# Patient Record
Sex: Female | Born: 1985 | Race: White | Hispanic: No | Smoking: Never smoker
Health system: Southern US, Community
[De-identification: ages and names within clinical notes are randomized; demographics above are authoritative.]

## PROBLEM LIST (undated history)

## (undated) DIAGNOSIS — M2242 Chondromalacia patellae, left knee: Secondary | ICD-10-CM

## (undated) DIAGNOSIS — F329 Major depressive disorder, single episode, unspecified: Secondary | ICD-10-CM

## (undated) DIAGNOSIS — N870 Mild cervical dysplasia: Secondary | ICD-10-CM

## (undated) DIAGNOSIS — B1920 Unspecified viral hepatitis C without hepatic coma: Secondary | ICD-10-CM

## (undated) DIAGNOSIS — F32A Depression, unspecified: Secondary | ICD-10-CM

## (undated) DIAGNOSIS — F29 Unspecified psychosis not due to a substance or known physiological condition: Secondary | ICD-10-CM

## (undated) DIAGNOSIS — S52209A Unspecified fracture of shaft of unspecified ulna, initial encounter for closed fracture: Secondary | ICD-10-CM

## (undated) DIAGNOSIS — M2241 Chondromalacia patellae, right knee: Secondary | ICD-10-CM

## (undated) DIAGNOSIS — F1910 Other psychoactive substance abuse, uncomplicated: Secondary | ICD-10-CM

## (undated) DIAGNOSIS — O1490 Unspecified pre-eclampsia, unspecified trimester: Secondary | ICD-10-CM

## (undated) DIAGNOSIS — M1990 Unspecified osteoarthritis, unspecified site: Secondary | ICD-10-CM

## (undated) HISTORY — DX: Unspecified pre-eclampsia, unspecified trimester: O14.90

## (undated) HISTORY — PX: INCONTINENCE SURGERY: SHX676

## (undated) HISTORY — DX: Mild cervical dysplasia: N87.0

## (undated) HISTORY — PX: KNEE SURGERY: SHX244

## (undated) HISTORY — PX: OTHER SURGICAL HISTORY: SHX169

## (undated) HISTORY — PX: DILATION AND CURETTAGE OF UTERUS: SHX78

## (undated) HISTORY — PX: TONSILLECTOMY: SUR1361

---

## 2005-06-26 ENCOUNTER — Other Ambulatory Visit: Admission: RE | Admit: 2005-06-26 | Discharge: 2005-06-26 | Payer: Self-pay | Admitting: Obstetrics and Gynecology

## 2005-07-18 ENCOUNTER — Other Ambulatory Visit: Admission: RE | Admit: 2005-07-18 | Discharge: 2005-07-18 | Payer: Self-pay | Admitting: Obstetrics and Gynecology

## 2006-01-11 ENCOUNTER — Other Ambulatory Visit: Admission: RE | Admit: 2006-01-11 | Discharge: 2006-01-11 | Payer: Self-pay | Admitting: Obstetrics and Gynecology

## 2006-06-07 ENCOUNTER — Inpatient Hospital Stay (HOSPITAL_COMMUNITY): Admission: AD | Admit: 2006-06-07 | Discharge: 2006-06-08 | Payer: Self-pay | Admitting: Obstetrics and Gynecology

## 2006-07-07 ENCOUNTER — Inpatient Hospital Stay (HOSPITAL_COMMUNITY): Admission: AD | Admit: 2006-07-07 | Discharge: 2006-07-10 | Payer: Self-pay | Admitting: Obstetrics and Gynecology

## 2006-07-11 ENCOUNTER — Observation Stay (HOSPITAL_COMMUNITY): Admission: AD | Admit: 2006-07-11 | Discharge: 2006-07-12 | Payer: Self-pay | Admitting: Obstetrics and Gynecology

## 2006-07-19 ENCOUNTER — Inpatient Hospital Stay (HOSPITAL_COMMUNITY): Admission: AD | Admit: 2006-07-19 | Discharge: 2006-07-23 | Payer: Self-pay | Admitting: Obstetrics and Gynecology

## 2008-06-08 ENCOUNTER — Ambulatory Visit (HOSPITAL_BASED_OUTPATIENT_CLINIC_OR_DEPARTMENT_OTHER): Admission: RE | Admit: 2008-06-08 | Discharge: 2008-06-08 | Payer: Self-pay | Admitting: Urology

## 2008-12-16 ENCOUNTER — Encounter: Admission: RE | Admit: 2008-12-16 | Discharge: 2008-12-16 | Payer: Self-pay | Admitting: Orthopaedic Surgery

## 2009-08-02 ENCOUNTER — Ambulatory Visit (HOSPITAL_COMMUNITY): Admission: RE | Admit: 2009-08-02 | Discharge: 2009-08-02 | Payer: Self-pay | Admitting: Obstetrics and Gynecology

## 2009-09-02 ENCOUNTER — Ambulatory Visit (HOSPITAL_COMMUNITY): Admission: RE | Admit: 2009-09-02 | Discharge: 2009-09-02 | Payer: Self-pay | Admitting: Obstetrics and Gynecology

## 2009-12-13 ENCOUNTER — Emergency Department (HOSPITAL_COMMUNITY): Admission: EM | Admit: 2009-12-13 | Discharge: 2009-12-13 | Payer: Self-pay | Admitting: Emergency Medicine

## 2010-08-20 LAB — URINALYSIS, ROUTINE W REFLEX MICROSCOPIC
Glucose, UA: 500 mg/dL — AB
Ketones, ur: NEGATIVE mg/dL
pH: 6 (ref 5.0–8.0)

## 2010-08-20 LAB — CBC
HCT: 37.9 % (ref 36.0–46.0)
Hemoglobin: 13 g/dL (ref 12.0–15.0)
MCHC: 34.3 g/dL (ref 30.0–36.0)
MCV: 91.3 fL (ref 78.0–100.0)
Platelets: 137 10*3/uL — ABNORMAL LOW (ref 150–400)
WBC: 8.3 10*3/uL (ref 4.0–10.5)

## 2010-08-20 LAB — COMPREHENSIVE METABOLIC PANEL
ALT: 16 U/L (ref 0–35)
AST: 23 U/L (ref 0–37)
BUN: 20 mg/dL (ref 6–23)
Calcium: 9.3 mg/dL (ref 8.4–10.5)
Chloride: 96 mEq/L (ref 96–112)
Potassium: 4 mEq/L (ref 3.5–5.1)
Sodium: 134 mEq/L — ABNORMAL LOW (ref 135–145)
Total Bilirubin: 0.5 mg/dL (ref 0.3–1.2)
Total Protein: 8 g/dL (ref 6.0–8.3)

## 2010-08-20 LAB — DIFFERENTIAL
Monocytes Absolute: 1.3 10*3/uL — ABNORMAL HIGH (ref 0.1–1.0)
Neutro Abs: 6.3 10*3/uL (ref 1.7–7.7)
Neutrophils Relative %: 76 % (ref 43–77)

## 2010-08-20 LAB — LIPASE, BLOOD: Lipase: 48 U/L (ref 11–59)

## 2010-08-20 LAB — URINE MICROSCOPIC-ADD ON

## 2010-08-27 LAB — CBC
HCT: 36.7 % (ref 36.0–46.0)
Hemoglobin: 12.2 g/dL (ref 12.0–15.0)
MCHC: 33.4 g/dL (ref 30.0–36.0)
RBC: 3.92 MIL/uL (ref 3.87–5.11)
RDW: 13.1 % (ref 11.5–15.5)

## 2010-08-29 ENCOUNTER — Emergency Department (HOSPITAL_COMMUNITY)
Admission: EM | Admit: 2010-08-29 | Discharge: 2010-08-29 | Disposition: A | Discharge status: Home / Self Care | Payer: Medicaid Other | Attending: Emergency Medicine | Admitting: Emergency Medicine

## 2010-08-29 DIAGNOSIS — M549 Dorsalgia, unspecified: Secondary | ICD-10-CM | POA: Insufficient documentation

## 2010-09-14 ENCOUNTER — Institutional Professional Consult (permissible substitution): Payer: Self-pay | Admitting: Cardiology

## 2010-09-14 ENCOUNTER — Ambulatory Visit (INDEPENDENT_AMBULATORY_CARE_PROVIDER_SITE_OTHER): Payer: Medicaid Other | Admitting: Cardiology

## 2010-09-14 ENCOUNTER — Encounter: Payer: Self-pay | Admitting: Cardiology

## 2010-09-14 VITALS — BP 106/68 | HR 84 | Resp 12 | Ht 62.0 in | Wt 123.0 lb

## 2010-09-14 DIAGNOSIS — IMO0002 Reserved for concepts with insufficient information to code with codable children: Secondary | ICD-10-CM

## 2010-09-14 DIAGNOSIS — R002 Palpitations: Secondary | ICD-10-CM

## 2010-09-14 DIAGNOSIS — R0602 Shortness of breath: Secondary | ICD-10-CM

## 2010-09-14 DIAGNOSIS — O1490 Unspecified pre-eclampsia, unspecified trimester: Secondary | ICD-10-CM

## 2010-09-14 NOTE — Assessment & Plan Note (Signed)
These sound benign and temporally related to pregnancy. Will check a TSH and Echo. If normal, no further cardiac workup. Reassurance given. Would not recommend pharmacologic treatment.

## 2010-09-14 NOTE — Patient Instructions (Signed)
Your physician recommends that you schedule a follow-up appointment in: AS NEEDED  Your physician recommends that you return for lab work in: TODAY BMET, TSH, MAGNESIUM 785.1

## 2010-09-14 NOTE — Progress Notes (Signed)
   Patient ID: Kathy Reed, female    DOB: Jul 29, 1985, 25 y.o.   MRN: [id number]  HPI Mrs Glyn Ade is a delightful 25 yo MF referred by Dr Jackelyn Knife for new onset palpitations. She is presently [redacted] weeks pregnant. She has 2 children with no previous cardiac issues. She was preeclamptic with her second child.  She has these palpitations randomly, usually lasts only seconds, with sxs of SOB. No other associated symptoms. Denies any chest pain, DOE, orthopnea, edema. No caffeine and does not smoke. No drugs or alcohol. Likes junk food but seems to healthy most of the time.  EKG today is normal   Review of Systems  Constitutional: Negative for diaphoresis and appetite change.  Respiratory: Positive for shortness of breath. Negative for apnea, cough, choking, chest tightness and wheezing.   Cardiovascular: Positive for palpitations. Negative for chest pain and leg swelling.  Musculoskeletal: Negative.   Neurological: Negative for dizziness, syncope, light-headedness, numbness and headaches.  All other systems reviewed and are negative.      Physical Exam  Constitutional: She is oriented to person, place, and time. She appears well-developed and well-nourished. No distress.  HENT:  Head: Normocephalic and atraumatic.  Eyes: EOM are normal. Pupils are equal, round, and reactive to light.  Neck: Neck supple. No JVD present. No thyromegaly present.  Cardiovascular: Normal rate, regular rhythm, normal heart sounds and intact distal pulses.   No murmur heard.      No click  Pulmonary/Chest: Effort normal and breath sounds normal. She has no wheezes. She has no rales.  Abdominal: Soft. Bowel sounds are normal.       pregnant  Musculoskeletal: Normal range of motion. She exhibits no edema.  Neurological: She is alert and oriented to person, place, and time.  Skin: Skin is warm and dry. She is not diaphoretic.  Psychiatric: She has a normal mood and affect.

## 2010-09-15 ENCOUNTER — Emergency Department (HOSPITAL_COMMUNITY)
Admission: EM | Admit: 2010-09-15 | Discharge: 2010-09-16 | Disposition: A | Discharge status: Home / Self Care | Payer: Medicaid Other | Attending: Emergency Medicine | Admitting: Emergency Medicine

## 2010-09-15 ENCOUNTER — Telehealth: Payer: Self-pay | Admitting: *Deleted

## 2010-09-15 DIAGNOSIS — R002 Palpitations: Secondary | ICD-10-CM

## 2010-09-15 DIAGNOSIS — M546 Pain in thoracic spine: Secondary | ICD-10-CM | POA: Insufficient documentation

## 2010-09-15 DIAGNOSIS — M545 Low back pain, unspecified: Secondary | ICD-10-CM | POA: Insufficient documentation

## 2010-09-15 DIAGNOSIS — G8929 Other chronic pain: Secondary | ICD-10-CM | POA: Insufficient documentation

## 2010-09-15 DIAGNOSIS — M542 Cervicalgia: Secondary | ICD-10-CM | POA: Insufficient documentation

## 2010-09-15 LAB — BASIC METABOLIC PANEL
BUN: 11 mg/dL (ref 6–23)
CO2: 25 mEq/L (ref 19–32)
Creatinine, Ser: 0.7 mg/dL (ref 0.4–1.2)
GFR: 109.72 mL/min (ref >60.00)
Glucose, Bld: 81 mg/dL (ref 70–99)
Potassium: 4.1 mEq/L (ref 3.5–5.1)
Sodium: 135 mEq/L (ref 135–145)

## 2010-09-15 LAB — TSH: TSH: 0.04 u[IU]/mL — ABNORMAL LOW (ref 0.35–5.50)

## 2010-09-15 NOTE — Telephone Encounter (Signed)
Pt aware of lab results and will return for lab work on 09/29/10.  Labs T4 & T3 Ordered. Mylo Red RN

## 2010-09-15 NOTE — Telephone Encounter (Signed)
Message copied by Lisabeth Devoid on Fri Sep 15, 2010  5:31 PM ------      Message from: Floreen Comber      Created: Fri Sep 15, 2010  2:04 PM                   ----- Message -----         From: Valera Castle, MD         Sent: 09/15/2010   1:44 PM           To: Barrie Folk, RN, Ellender Hose, RN, #            Need free T4 and T3

## 2010-09-18 LAB — POCT PREGNANCY, URINE: Preg Test, Ur: NEGATIVE

## 2010-09-18 LAB — ABO/RH: ABO/RH(D): B POS

## 2010-09-18 LAB — CBC
HCT: 35.2 % — ABNORMAL LOW (ref 36.0–46.0)
MCV: 86.7 fL (ref 78.0–100.0)
RDW: 14.2 % (ref 11.5–15.5)

## 2010-09-18 LAB — APTT: aPTT: 32 seconds (ref 24–37)

## 2010-09-18 LAB — TYPE AND SCREEN
ABO/RH(D): B POS
Antibody Screen: NEGATIVE

## 2010-09-23 ENCOUNTER — Emergency Department: Payer: Self-pay | Admitting: Emergency Medicine

## 2010-09-29 ENCOUNTER — Other Ambulatory Visit (INDEPENDENT_AMBULATORY_CARE_PROVIDER_SITE_OTHER): Payer: Medicaid Other | Admitting: *Deleted

## 2010-09-29 DIAGNOSIS — R002 Palpitations: Secondary | ICD-10-CM

## 2010-09-29 LAB — T4: T4, Total: 14.9 ug/dL — ABNORMAL HIGH (ref 5.0–12.5)

## 2010-09-29 LAB — T3: T3, Total: 159.9 ng/dL (ref 80.0–204.0)

## 2010-10-04 ENCOUNTER — Telehealth: Payer: Self-pay | Admitting: Cardiology

## 2010-10-04 DIAGNOSIS — E0590 Thyrotoxicosis, unspecified without thyrotoxic crisis or storm: Secondary | ICD-10-CM

## 2010-10-04 NOTE — Telephone Encounter (Signed)
Pt calling for results of blood test.

## 2010-10-05 NOTE — Telephone Encounter (Signed)
Pt aware of lab results and referral to Dr. Everardo All b/c of elevated T4 T3. Mylo Red RN

## 2010-10-17 NOTE — Op Note (Signed)
NAMEMARILOUISE, Kathy Reed            ACCOUNT NO.:  [id number]   MEDICAL RECORD NO.:  [id number]          PATIENT TYPE:  AMB   LOCATION:  NESC                         FACILITY:  [facility]   PHYSICIAN:  Martina Sinner, MD DATE OF BIRTH:  May 21, 1986   DATE OF PROCEDURE:  06/08/2008  DATE OF DISCHARGE:                               OPERATIVE REPORT   SURGEON:  Alfredo Martinez, M.D.   PREOPERATIVE DIAGNOSIS:  Mixed stress and urge incontinence.   POSTOPERATIVE DIAGNOSIS:  Mixed stress and urge incontinence.   SURGERY:  Sling [facility]) cystourethropexy plus cystoscopy.   Kathy Reed is a young woman with moderate stress incontinence and also an  overactive bladder that did not respond to medical and behavioral  therapy.   The patient prepped and draped in usual fashion.  Extra care was taken  with leg positioning to minimize the risk of compartment syndrome,  neuropathy and DVT.  She was given preoperative antibiotics.  Preoperative lab tests were normal.   Two 1-cm incisions were made, 1.5 cm lateral to the midline and 1  fingerbreadth above the symphysis pubis.  A 2-cm incision was made  underneath the mid urethra after instilling 7 mL of lidocaine  epinephrine mixture.  I made a deep incision and dissected sharply to  the urethrovesical angle bilaterally.  Oddly, her vaginal wall and  pubocervical fascia and soft tissues were very stretchable and elastic.   With the bladder emptied, I passed the [facility] needles on top of and along  the back of the symphysis pubis parallel to the midline on the pulp of  my index finger.  Initially, I tried to stay a little bit more lateral  to stay away from the bladder since she so thin.   I then cystoscoped the patient.  I felt that on initial inspection with  a full bladder her bladder looked fine.  There was good ureteral jet.  With deflection of the needles, I felt that the needles were fairly  close to the bladder with good indentations.   I  emptied the bladder and removed the needles and replaced them again.  The right needle was actually passed a total of 3 times.  The left was  passed twice.   I then cystoscoped the patient.  I was happy with the positioning.  In  my opinion, her anatomy is such that there will be a little bit of an  indentation of the bladder with deflection of the trocar, but at rest  with the bladder full, there was absolutely no indentation.  I did not  take the needles too lateral, recognizing that the ilioinguinal nerve  was in that location.  There was little to no bleeding.   With the bladder empty, I attached the [facility] sling and brought it up  through the retropubic space.  I tensioned over the fat part with a  moderate-sized [employer].  I cut below the blue dots and removed the  sheaths.  There was hypermobility of the sling.  There is no spring back  effect.  It was in the mid urethra.  Copious irrigation was used for  all  incisions.  Sling was cut below the skin.  Four-0 Monocryl and Dermabond  was used.  Two-0 Vicryl followed by 2 interrupted sutures were used for  the vaginal wall incision.  Once again, her tissue was very elastic.  Vaginal pack was inserted.  Catheter plug was utilized on the Foley  after emptying her bladder.   Hopefully, Kathy Reed will achieve her treatment goal.  I am concerned about  her overactive bladder persisting post procedure.           ______________________________  Martina Sinner, MD  Electronically Signed     SAM/MEDQ  D:  06/08/2008  T:  06/08/2008  Job:  [id number]

## 2010-10-20 NOTE — Discharge Summary (Signed)
Kathy Reed, Kathy Reed            ACCOUNT NO.:  [id number]   MEDICAL RECORD NO.:  [id number]          PATIENT TYPE:  INP   LOCATION:  9103                          [facility]ACILITY:  [facility]   PHYSICIAN:  Zenaida Niece, M.D.DATE OF BIRTH:  [birth year]   DATE OF ADMISSION:  07/07/2006  DATE OF DISCHARGE:  07/10/2006                               DISCHARGE SUMMARY   ADMISSION DIAGNOSES:  1. Intrauterine pregnancy at 38 weeks.  2. Previous cesarean section.  3. In labor.   DISCHARGE DIAGNOSES:  1. Intrauterine pregnancy at 38 weeks.  2. Previous cesarean section.  3. In labor.   PROCEDURES:  On February 3, she underwent repeat low transverse cesarean  section.   HISTORY AND PHYSICAL:  This is a 25 year old white female, gravida 2,  para 1-0-0-1, with an EGA of [redacted] weeks, who presents with the complaint  of regular contractions.  Evaluation in Triage revealed cervix to be 2-  to 3-cm dilated, 50% effaced, -2 station with a vertex presentation and  regular contractions.  Prenatal care was complicated by the fact that  she had a previous cesarean section and is scheduled for repeat cesarean  section on February 8 and has a possible history of eclampsia.  She also  has depression and was started on Wellbutrin during pregnancy.   PRENATAL LABORATORY DATA:  Blood type is B positive with a negative  antibody screen, rubella immune.  Hepatitis B surface antigen negative.  RPR nonreactive.  HIV is negative.  Gonorrhea and Chlamydia negative.  Group B strep is negative.  Quad screen is normal.   PAST OBSTETRICAL HISTORY:  One cesarean section at 35 weeks.  Baby  weighed 4 pounds 10 ounces.  That was complicated by severe PIH and  possibly eclampsia.   PAST GYNECOLOGICAL HISTORY:  History of CIN-1.   SURGICAL HISTORY:  1. Low transverse cesarean section.  2. Knee surgery, tonsillectomy.  3. Wisdom tooth removal.   PHYSICAL EXAM:  VITAL SIGNS:  She is afebrile with stable vital signs.  Fetal heart tracing is reactive.  ABDOMEN:  Gravid and nontender, with an estimated fetal weight of 7-1/2  pounds and she does have a well-healed transverse scar.  PELVIC:  Cervix is 2-3, 50, -2, vertex presentation.   HOSPITAL COURSE:  The patient was admitted and taken to the operating  room for a repeat cesarean section; this was done under general  anesthesia at the patient's request.  She delivered a viable female infant  with Apgars of 8 and 9 who weighed 8 pounds.  Postoperatively, she had  no significant complications.  She did have one temperature to 101.2 on  the morning of postoperative day #1, but other than that, was afebrile.  Pre-delivery hemoglobin 10.2, postdelivery 8.9.  On postpartum #3, she  was felt to be stable enough for discharge home.  Her incision was  healing well and staples were removed and Steri-Strips applied.   DISCHARGE INSTRUCTIONS:  Regular diet, pelvic rest and no strenuous  activity.  Followup is in 2 weeks for an incision check.  She is given  our discharge pamphlet.   MEDICATIONS:  1. Percocet, #30, one to two p.o. q.4-6 h. p.r.n. pain.  2. Over-the-counter ibuprofen as needed.      Zenaida Niece, M.D.  Electronically Signed     TDM/MEDQ  D:  07/10/2006  T:  07/10/2006  Job:  [id number]

## 2010-10-20 NOTE — H&P (Signed)
Kathy Reed, DELAO            ACCOUNT NO.:  [id number]   MEDICAL RECORD NO.:  [id number]          PATIENT TYPE:  MAT   LOCATION:  MATC                          FACILITY:  [facility]   PHYSICIAN:  Malachi Pro. Ambrose Mantle, M.D. DATE OF BIRTH:  [birth year]   DATE OF ADMISSION:  07/11/2006  DATE OF DISCHARGE:  07/11/2006                              HISTORY & PHYSICAL   PRESENT ILLNESS:  [age]-year-old white female, para 1-1-0-2, admitted 4  days postpartum with fever and chills.  The patient delivered by repeat  C-section, in labor, on July 07, 2006.  She had a fever to 101.2 on  the 1st postop day, then was afebrile.  She was discharged July 10, 2006.  At approximately 4 p.m. on July 10, 2006, she awoke with  chills and a temperature of 101.9 followed by a drenching sweat.  Subsequently, her temperature was 102.1, 100.8, 101.1 and 101.3.  She  had 1 more chill followed by a sweat.  Her breast milk came in at  approximately 3 p.m.  She denies sore throat, upper respiratory  symptoms, cough, nausea, vomiting, diarrhea, dysuria and significant  abdominal pain.   ALLERGIES:  No known allergies.   OPERATIONS:  1. C-section x2.  2. Knee surgery.  3. T&A.   PAST MEDICAL HISTORY:   ILLNESSES:  1. CIN-1.  2. Possible eclampsia with the 1st pregnancy.   SOCIAL HISTORY:  Alcohol, tobacco and drugs:  None.   FAMILY HISTORY:  Negative.   OBSTETRICAL HISTORY:  In March 2005, the patient delivered a 4-pound 10-  ounce female at 35 weeks by C-section with severe PIH and possible  eclampsia  In February 2008, she delivered an 8-pound 0-ounce female by  repeat C-section, in early labor.   PHYSICAL EXAMINATION:  VITAL SIGNS:  On admission, the patient's  temperature was 100.5, pulse was 114, respirations 20 and blood pressure  125/82.  HEENT:  Normal.  HEART:  Normal, sinus tachycardia, no murmurs.  LUNGS:  Clear to auscultation.  BREASTS:  There is no redness.  The breasts are full of  milk; they are  slightly tender.  No masses are palpable.  ABDOMEN:  Soft and nontender.  The fundus is slightly tender.  The  incision is healing well; Steri-Strips are in place.  PELVIC:  Vulva and vagina show lochia present.  The cervix is clean.  The uterus is anterior, approximately 16 weeks' size, slightly tender.  The adnexa are clear.  There is no evidence of purulent material in the  vagina.  EXTREMITIES:  The legs show no evidence of phlebitis.  Denna Haggard is  negative.  There are no red streaks.   ADMITTING IMPRESSION:  Postpartum fever, uncertain origin, most likely  endometriosis or fever from breast milk letdown.   PLAN:  Catheterized urine will be sent for culture and sensitivity and  urinalysis, blood cultures x2, CBC, comprehensive metabolic profile, STD  panel, endometrial cultures for aerobic and anaerobic and the patient  will be begun on intravenous Unasyn.      Malachi Pro. Ambrose Mantle, M.D.  Electronically Signed     TFH/MEDQ  D:  07/11/2006  T:  07/11/2006  Job:  [id number]

## 2010-10-20 NOTE — Discharge Summary (Signed)
Kathy Reed, Kathy Reed            ACCOUNT NO.:  [id number]   MEDICAL RECORD NO.:  [id number]          PATIENT TYPE:  INP   LOCATION:  9316                          FACILITY:  [facility]   PHYSICIAN:  Malachi Pro. Ambrose Mantle, M.D. DATE OF BIRTH:  25-Apr-1986   DATE OF ADMISSION:  07/19/2006  DATE OF DISCHARGE:  07/23/2006                               DISCHARGE SUMMARY   HISTORY OF PRESENT ILLNESS:  This is a 25 year old white female admitted  12 days' postpartum with fever to 103.5 and headaches.  The patient had  a C-section on July 07, 2006.  She was febrile initially, then this  resolved without treatment.  She was discharged on the 3rd postpartum  day, readmitted that day with high fever, chills and sweats, placed on  Unasyn; she became afebrile and was discharged and remained afebrile  until February 14, when she had a fever to 103.5 with chills and sweats.   HOSPITAL COURSE:  She was readmitted to the hospital, placed on  clindamycin and gentamicin.  Her last elevated temperature was just over  100 degrees on the evening of July 20, 2006.  She has remained  completely afebrile for 60 hours and is now ready for discharge.  During  the hospitalization, she had a CT scan of the abdomen and pelvis which  was normal except for a suggestion of a 2.5-cm structure in the fundus  of the uterus.  This was characterized by ultrasound as a 2.5 x 2 x 3.7-  cm focal area of tissue along the posterior endometrium suspicious for  retained products of conception and there was a small to moderate  complex fluid/blood collection within the uterine cavity.  I consulted  with Dr. Margot Ables at [facility] to see  if this area should be approached surgically; he felt that with the  patient responding to antibiotics, that we should keep her afebrile, on  IV antibiotics for 48-72 hours, then discharge on oral antibiotics and  if her fever recurred, to pursue the area in  question in the endometrial  cavity.  Blood cultures so far negative.  Initial hemoglobin 11.3,  hematocrit 35.3, white count 6300, platelet count 386,000, seventy-seven  segs, 11 lymphs, 12 monos.  Metabolic profile was normal except for an  alkaline phosphatase of 156.  Urinalysis showed a specific gravity of  greater than 1.030, but otherwise was negative except for 7-10 red  cells.   FINAL DIAGNOSIS:  Postpartum fever, uncertain margin.   OPERATIONS:  None.   TREATMENT:  Intravenous antibiotics.   FINAL CONDITION:  Improved.   INSTRUCTIONS:  Include our regular discharge instructions as given  previously, to report any temperature elevation greater than 100.4  degrees.   DISCHARGE MEDICATIONS:  Take doxycycline 100 mg by mouth twice a day for  10 days.   FOLLOWUP:  Return to the office to see Dr. Jackelyn Knife on August 02, 2006.      Malachi Pro. Ambrose Mantle, M.D.  Electronically Signed     TFH/MEDQ  D:  07/23/2006  T:  07/23/2006  Job:  [id number]

## 2010-10-20 NOTE — H&P (Signed)
NAMESHERISA, GILVIN            ACCOUNT NO.:  [id number]   MEDICAL RECORD NO.:  [id number]          PATIENT TYPE:  MAT   LOCATION:  MATC                          [facility]ACILITY[facility]   PHYSICIAN:  Malachi Pro. Ambrose Mantle, M.D. DATE OF BIRTH:  April 28, 1986   DATE OF ADMISSION:  07/19/2006  DATE OF DISCHARGE:                              HISTORY & PHYSICAL   HISTORY OF PRESENT ILLNESS:  This is a 25 year old white female, para 1-  1-0-2, admitted 12 days postpartum with fever to 103.5 and headaches.  This lady had a C-section on July 07, 2006.  She had a temperature to  101.2 on the first postop day but then became afebrile.  She was  discharged on July 10, 2006, and readmitted the same day with chills,  fever, and temperature to 102.1.  She had an absence of significant  symptoms related to the fever other than the chills, fever, and sweats.  She was placed on Unasyn and cultures were taken from the cervix, the  endometrium, the urine and the blood.  All were negative.  She did well  until July 18, 2006, and her temperature was above 99.  Today, her  temperature rose to 103.5 with chills and sweats.  She denies sore  throat.  She did have some cough on July 18, 2006, and also had  diarrhea x1 on July 18, 2006.  She has had no nausea and vomiting.  No urinary symptoms.   PHYSICAL EXAMINATION:  VITAL SIGNS:  Her temperature was 99.5 on  arrival, subsequently went to 102.3.  Her blood pressure is 120/74,  pulse was initially 112 to 95, respirations 20.  HEENT:  No cranial abnormalities.  The patient did appear slightly pale.  She is cold, but otherwise in no distress.  NECK:  Supple.  She can touch her chin to her chest.  LUNGS:  Clear to auscultation.  HEART:  Normal sinus rhythm, no murmurs.  BREASTS:  Soft without masses.  There is no red streaking of the  breasts.  There is no specific tenderness of the breast.  ABDOMEN:  Soft and nontender, no masses are palpable.  The  incision is  well healed.  There is no CVA tenderness.  GU:  The vulva and vagina showed bloody lochia present.  The uterus is  about 14 to 15 weeks size, anterior, slightly irregular, but not tender.  The adnexa are free of masses.   ADMITTING IMPRESSION:  Postpartum fever of uncertain origin.  She has  undergone two blood cultures, CBC, and comprehensive metabolic profile.  I did not repeat the cultures of the cervix, endometrium, and urine.  I  have ordered a CT scan.  The patient will be admitted and placed on IV  antibiotics.      Malachi Pro. Ambrose Mantle, M.D.  Electronically Signed     TFH/MEDQ  D:  07/19/2006  T:  07/19/2006  Job:  [id number]

## 2010-10-20 NOTE — Op Note (Signed)
NAMEAMANIE, MCCULLEY            ACCOUNT NO.:  [id number]   MEDICAL RECORD NO.:  [id number]          PATIENT TYPE:  INP   LOCATION:  9103                          [facility]ACILITY:  [facility]   PHYSICIAN:  Zenaida Niece, M.D.DATE OF BIRTH:  1986/04/08   DATE OF PROCEDURE:  07/07/2006  DATE OF DISCHARGE:                               OPERATIVE REPORT   PREOPERATIVE DIAGNOSES:  1. Intrauterine pregnancy at 38+ weeks.  2. Previous cesarean section.  3. Early labor.   POSTOPERATIVE DIAGNOSES:  1. Intrauterine pregnancy at 38+ weeks.  2. Previous cesarean section.  3. Early labor.   PROCEDURE:  Repeat low transverse cesarean section without extensions.   SURGEON:  Zenaida Niece, M.D.   ANESTHESIA:  General.   SPECIMENS:  Placenta sent to labor and delivery.   ESTIMATED BLOOD LOSS:  800 mL.   COMPLICATIONS:  She did have some abdominal wall scarring, but otherwise  normal anatomy.  She delivered a viable female infant with Apgars of 8 and  9 that weighed 8 pounds.   PROCEDURE IN DETAIL:  The patient was taken to the operating room and  placed in the dorsal supine position with a left lateral tilt.  The  abdomen was prepped and draped in the usual sterile fashion and a Foley  catheter inserted.  General anesthesia was induced.  The abdomen was  then entered via a standard Pfannenstiel's incision through her previous  scar, with some scar tissue noted.  A bladder blade was placed and a 4  cm transverse incision was made in the lower uterine segment, pushing  the bladder inferior.  Once the uterine cavity was entered, the incision  was extended bilaterally digitally.  The fetal vertex was grasped and  delivered through the incision atraumatically.  Mouth and nares were  suctioned and the remainder of the infant delivered atraumatically.  Cord was doubly clamped and cut, and the infant handed to the awaiting  pediatric team.  Cord blood was obtained and the placenta delivered  spontaneously.  The uterine incision was inspected and found to be free  of extensions.  The uterus was wiped dry with a clean lap pad to remove  all clots and debris.  The uterine incision was then closed in one  layer, being a running, locking layer with #1 chromic -- with adequate  hemostasis.  Tubes and ovaries were inspected and found to be normal,  and paracolic gutters were blotted of all clots and debris.  The uterine  incision was again inspected and found to be hemostatic.  The subfascial  space was then irrigated and made hemostatic with electrocautery.  The  fascia was closed in a running fashion, starting at both ends and  meeting in the middle with 0 Vicryl.  The subcutaneous tissue was then  irrigated and made hemostatic with electrocautery.  The skin was closed  with staples, followed by a sterile dressing.  The patient tolerated the  procedure well.  She was extubated in the operating room and taken to  the recovery room in stable condition.  Counts were correct and she  received Ancef 1  gram IV after cord clamp.      Zenaida Niece, M.D.  Electronically Signed     TDM/MEDQ  D:  07/07/2006  T:  07/07/2006  Job:  [id number]

## 2010-11-18 ENCOUNTER — Emergency Department (HOSPITAL_COMMUNITY)
Admission: EM | Admit: 2010-11-18 | Discharge: 2010-11-18 | Disposition: A | Discharge status: Home / Self Care | Payer: Medicaid Other | Attending: Emergency Medicine | Admitting: Emergency Medicine

## 2010-11-18 DIAGNOSIS — Z0389 Encounter for observation for other suspected diseases and conditions ruled out: Secondary | ICD-10-CM | POA: Insufficient documentation

## 2011-04-02 ENCOUNTER — Encounter (HOSPITAL_COMMUNITY): Payer: Self-pay | Admitting: Anesthesiology

## 2011-04-02 ENCOUNTER — Encounter (HOSPITAL_COMMUNITY)
Admission: AD | Disposition: A | Discharge status: Home / Self Care | Payer: Self-pay | Source: Ambulatory Visit | Attending: Obstetrics and Gynecology

## 2011-04-02 ENCOUNTER — Encounter (HOSPITAL_COMMUNITY): Payer: Self-pay | Admitting: *Deleted

## 2011-04-02 ENCOUNTER — Inpatient Hospital Stay (HOSPITAL_COMMUNITY)
Admission: AD | Admit: 2011-04-02 | Discharge: 2011-04-06 | DRG: 765 | Disposition: A | Discharge status: Home / Self Care | Payer: Medicaid Other | Source: Ambulatory Visit | Attending: Obstetrics and Gynecology | Admitting: Obstetrics and Gynecology

## 2011-04-02 ENCOUNTER — Other Ambulatory Visit: Payer: Self-pay | Admitting: Obstetrics and Gynecology

## 2011-04-02 ENCOUNTER — Inpatient Hospital Stay (HOSPITAL_COMMUNITY): Payer: Medicaid Other | Admitting: Anesthesiology

## 2011-04-02 DIAGNOSIS — O34219 Maternal care for unspecified type scar from previous cesarean delivery: Principal | ICD-10-CM | POA: Diagnosis present

## 2011-04-02 DIAGNOSIS — IMO0002 Reserved for concepts with insufficient information to code with codable children: Secondary | ICD-10-CM | POA: Diagnosis present

## 2011-04-02 DIAGNOSIS — F1110 Opioid abuse, uncomplicated: Secondary | ICD-10-CM | POA: Diagnosis present

## 2011-04-02 DIAGNOSIS — O8612 Endometritis following delivery: Secondary | ICD-10-CM | POA: Diagnosis not present

## 2011-04-02 DIAGNOSIS — O411090 Infection of amniotic sac and membranes, unspecified, unspecified trimester, not applicable or unspecified: Secondary | ICD-10-CM | POA: Diagnosis present

## 2011-04-02 DIAGNOSIS — O99344 Other mental disorders complicating childbirth: Secondary | ICD-10-CM | POA: Diagnosis present

## 2011-04-02 LAB — COMPREHENSIVE METABOLIC PANEL
Albumin: 1.9 g/dL — ABNORMAL LOW (ref 3.5–5.2)
Alkaline Phosphatase: 137 U/L — ABNORMAL HIGH (ref 39–117)
BUN: 10 mg/dL (ref 6–23)
BUN: 9 mg/dL (ref 6–23)
Calcium: 9.2 mg/dL (ref 8.4–10.5)
Creatinine, Ser: 0.66 mg/dL (ref 0.50–1.10)
Creatinine, Ser: 0.7 mg/dL (ref 0.50–1.10)
GFR calc Af Amer: >90 mL/min (ref >90)
Glucose, Bld: 78 mg/dL (ref 70–99)
Potassium: 3.7 mEq/L (ref 3.5–5.1)
Total Protein: 6.3 g/dL (ref 6.0–8.3)
Total Protein: 5.1 g/dL — ABNORMAL LOW (ref 6.0–8.3)

## 2011-04-02 LAB — URINALYSIS, ROUTINE W REFLEX MICROSCOPIC
Glucose, UA: NEGATIVE mg/dL
Glucose, UA: NEGATIVE mg/dL
Specific Gravity, Urine: >1.03 — ABNORMAL HIGH (ref 1.005–1.030)
Specific Gravity, Urine: <1.005 — ABNORMAL LOW (ref 1.005–1.030)
Urobilinogen, UA: 0.2 mg/dL (ref 0.0–1.0)
pH: 6 (ref 5.0–8.0)

## 2011-04-02 LAB — CBC
HCT: 34.6 % — ABNORMAL LOW (ref 36.0–46.0)
HCT: 29.1 % — ABNORMAL LOW (ref 36.0–46.0)
Hemoglobin: 11.1 g/dL — ABNORMAL LOW (ref 12.0–15.0)
MCHC: 32 g/dL (ref 30.0–36.0)
Platelets: 170 10*3/uL (ref 150–400)
RBC: 4.18 MIL/uL (ref 3.87–5.11)
RDW: 14.2 % (ref 11.5–15.5)
RDW: 14.3 % (ref 11.5–15.5)
WBC: 11.9 10*3/uL — ABNORMAL HIGH (ref 4.0–10.5)

## 2011-04-02 LAB — URIC ACID: Uric Acid, Serum: 4.8 mg/dL (ref 2.4–7.0)

## 2011-04-02 LAB — URINE MICROSCOPIC-ADD ON

## 2011-04-02 SURGERY — Surgical Case
Anesthesia: Epidural | Site: Abdomen | Wound class: Clean Contaminated

## 2011-04-02 MED ORDER — KETOROLAC TROMETHAMINE 30 MG/ML IJ SOLN
30.0000 mg | Freq: Four times a day (QID) | INTRAMUSCULAR | Status: AC | PRN
  Administered 2011-04-03: 30 mg via INTRAVENOUS
  Filled 2011-04-02: qty 1

## 2011-04-02 MED ORDER — KETOROLAC TROMETHAMINE 30 MG/ML IJ SOLN: 30.0000 mg | Freq: Four times a day (QID) | INTRAMUSCULAR | Status: AC | PRN

## 2011-04-02 MED ORDER — OXYTOCIN 20 UNITS IN LACTATED RINGERS INFUSION - SIMPLE: 125.0000 mL/h | INTRAVENOUS | Status: AC

## 2011-04-02 MED ORDER — MORPHINE SULFATE 0.5 MG/ML IJ SOLN
INTRAMUSCULAR | Status: AC
  Filled 2011-04-02: qty 10

## 2011-04-02 MED ORDER — CEFAZOLIN SODIUM 1-5 GM-% IV SOLN
1.0000 g | Freq: Three times a day (TID) | INTRAVENOUS | Status: AC
  Administered 2011-04-02: 1 g via INTRAVENOUS
  Filled 2011-04-02 (×2): qty 50

## 2011-04-02 MED ORDER — MEASLES, MUMPS & RUBELLA VAC ~~LOC~~ INJ: 0.5000 mL | INJECTION | Freq: Once | SUBCUTANEOUS | Status: DC

## 2011-04-02 MED ORDER — NALBUPHINE SYRINGE 5 MG/0.5 ML
5.0000 mg | INJECTION | INTRAMUSCULAR | Status: DC | PRN
  Filled 2011-04-02: qty 1

## 2011-04-02 MED ORDER — SUCCINYLCHOLINE CHLORIDE 20 MG/ML IJ SOLN
INTRAMUSCULAR | Status: AC
  Filled 2011-04-02: qty 1

## 2011-04-02 MED ORDER — SIMETHICONE 80 MG PO CHEW
80.0000 mg | CHEWABLE_TABLET | Freq: Three times a day (TID) | ORAL | Status: DC
  Administered 2011-04-02 – 2011-04-05 (×6): 80 mg via ORAL

## 2011-04-02 MED ORDER — OXYTOCIN 10 UNIT/ML IJ SOLN
INTRAMUSCULAR | Status: DC | PRN
  Administered 2011-04-02 (×2): 20 [IU] via INTRAMUSCULAR

## 2011-04-02 MED ORDER — OXYTOCIN 10 UNIT/ML IJ SOLN
INTRAMUSCULAR | Status: AC
  Filled 2011-04-02: qty 2

## 2011-04-02 MED ORDER — DIPHENHYDRAMINE HCL 50 MG/ML IJ SOLN: 12.5000 mg | Freq: Four times a day (QID) | INTRAMUSCULAR | Status: DC | PRN

## 2011-04-02 MED ORDER — LACTATED RINGERS IV SOLN
INTRAVENOUS | Status: DC
  Administered 2011-04-03 – 2011-04-05 (×6): via INTRAVENOUS

## 2011-04-02 MED ORDER — SODIUM CHLORIDE 0.9 % IR SOLN
Status: DC | PRN
  Administered 2011-04-02: 1000 mL

## 2011-04-02 MED ORDER — DIPHENHYDRAMINE HCL 50 MG/ML IJ SOLN: 25.0000 mg | INTRAMUSCULAR | Status: DC | PRN

## 2011-04-02 MED ORDER — FAMOTIDINE IN NACL 20-0.9 MG/50ML-% IV SOLN
INTRAVENOUS | Status: AC
  Filled 2011-04-02: qty 50

## 2011-04-02 MED ORDER — CITRIC ACID-SODIUM CITRATE 334-500 MG/5ML PO SOLN: 30.0000 mL | Freq: Once | ORAL | Status: DC

## 2011-04-02 MED ORDER — CITRIC ACID-SODIUM CITRATE 334-500 MG/5ML PO SOLN
ORAL | Status: AC
  Filled 2011-04-02: qty 15

## 2011-04-02 MED ORDER — LABETALOL HCL 5 MG/ML IV SOLN
20.0000 mg | Freq: Once | INTRAVENOUS | Status: AC
  Administered 2011-04-03: 20 mg via INTRAVENOUS
  Filled 2011-04-02: qty 4

## 2011-04-02 MED ORDER — DIPHENHYDRAMINE HCL 25 MG PO CAPS: 25.0000 mg | ORAL_CAPSULE | ORAL | Status: DC | PRN

## 2011-04-02 MED ORDER — OXYCODONE-ACETAMINOPHEN 5-325 MG PO TABS
1.0000 | ORAL_TABLET | ORAL | Status: DC | PRN
  Administered 2011-04-03: 2 via ORAL
  Administered 2011-04-03: 1 via ORAL
  Administered 2011-04-03 – 2011-04-04 (×4): 2 via ORAL
  Filled 2011-04-02 (×7): qty 2

## 2011-04-02 MED ORDER — SCOPOLAMINE 1 MG/3DAYS TD PT72
1.0000 | MEDICATED_PATCH | Freq: Once | TRANSDERMAL | Status: DC
  Filled 2011-04-02: qty 1

## 2011-04-02 MED ORDER — DIPHENHYDRAMINE HCL 50 MG/ML IJ SOLN: 12.5000 mg | INTRAMUSCULAR | Status: DC | PRN

## 2011-04-02 MED ORDER — DIPHENHYDRAMINE HCL 25 MG PO CAPS: 25.0000 mg | ORAL_CAPSULE | Freq: Four times a day (QID) | ORAL | Status: DC | PRN

## 2011-04-02 MED ORDER — FENTANYL CITRATE 0.05 MG/ML IJ SOLN
INTRAMUSCULAR | Status: DC | PRN
  Administered 2011-04-02: 15 ug via INTRAVENOUS

## 2011-04-02 MED ORDER — ROCURONIUM BROMIDE 50 MG/5ML IV SOLN
INTRAVENOUS | Status: AC
  Filled 2011-04-02: qty 1

## 2011-04-02 MED ORDER — IBUPROFEN 600 MG PO TABS
600.0000 mg | ORAL_TABLET | Freq: Four times a day (QID) | ORAL | Status: DC | PRN
  Filled 2011-04-02 (×10): qty 1

## 2011-04-02 MED ORDER — LABETALOL HCL 5 MG/ML IV SOLN
2.0000 mg | Freq: Once | INTRAVENOUS | Status: DC
  Filled 2011-04-02: qty 4

## 2011-04-02 MED ORDER — ONDANSETRON HCL 4 MG/2ML IJ SOLN: 4.0000 mg | Freq: Four times a day (QID) | INTRAMUSCULAR | Status: DC | PRN

## 2011-04-02 MED ORDER — SENNOSIDES-DOCUSATE SODIUM 8.6-50 MG PO TABS
2.0000 | ORAL_TABLET | Freq: Every day | ORAL | Status: DC
  Administered 2011-04-02 – 2011-04-05 (×4): 2 via ORAL

## 2011-04-02 MED ORDER — CEFAZOLIN SODIUM-DEXTROSE 2-3 GM-% IV SOLR
2.0000 g | Freq: Once | INTRAVENOUS | Status: AC
  Administered 2011-04-02: 2 g via INTRAVENOUS
  Filled 2011-04-02: qty 50

## 2011-04-02 MED ORDER — TETANUS-DIPHTH-ACELL PERTUSSIS 5-2.5-18.5 LF-MCG/0.5 IM SUSP: 0.5000 mL | Freq: Once | INTRAMUSCULAR | Status: DC

## 2011-04-02 MED ORDER — ZOLPIDEM TARTRATE 5 MG PO TABS: 5.0000 mg | ORAL_TABLET | Freq: Every evening | ORAL | Status: DC | PRN

## 2011-04-02 MED ORDER — FAMOTIDINE IN NACL 20-0.9 MG/50ML-% IV SOLN: 20.0000 mg | Freq: Once | INTRAVENOUS | Status: DC

## 2011-04-02 MED ORDER — LACTATED RINGERS IV BOLUS (SEPSIS)
250.0000 mL | Freq: Once | INTRAVENOUS | Status: AC
  Administered 2011-04-02: 250 mL via INTRAVENOUS

## 2011-04-02 MED ORDER — MORPHINE SULFATE (PF) 1 MG/ML IV SOLN
INTRAVENOUS | Status: DC
  Administered 2011-04-02: 1 mg via INTRAVENOUS
  Administered 2011-04-02: 25 mg via INTRAVENOUS
  Administered 2011-04-03: 2 mg via INTRAVENOUS
  Filled 2011-04-02 (×2): qty 25

## 2011-04-02 MED ORDER — SIMETHICONE 80 MG PO CHEW: 80.0000 mg | CHEWABLE_TABLET | ORAL | Status: DC | PRN

## 2011-04-02 MED ORDER — LANOLIN HYDROUS EX OINT: 1.0000 "application " | TOPICAL_OINTMENT | CUTANEOUS | Status: DC | PRN

## 2011-04-02 MED ORDER — PHENYLEPHRINE 40 MCG/ML (10ML) SYRINGE FOR IV PUSH (FOR BLOOD PRESSURE SUPPORT)
PREFILLED_SYRINGE | INTRAVENOUS | Status: AC
  Filled 2011-04-02: qty 10

## 2011-04-02 MED ORDER — HYDROMORPHONE HCL 1 MG/ML IJ SOLN
INTRAMUSCULAR | Status: AC
  Administered 2011-04-02: 0.5 mg via INTRAVENOUS
  Filled 2011-04-02: qty 1

## 2011-04-02 MED ORDER — SODIUM CHLORIDE 0.9 % IJ SOLN: 9.0000 mL | INTRAMUSCULAR | Status: DC | PRN

## 2011-04-02 MED ORDER — NALOXONE HCL 0.4 MG/ML IJ SOLN: 0.4000 mg | INTRAMUSCULAR | Status: DC | PRN

## 2011-04-02 MED ORDER — DIBUCAINE 1 % RE OINT: 1.0000 "application " | TOPICAL_OINTMENT | RECTAL | Status: DC | PRN

## 2011-04-02 MED ORDER — BUPIVACAINE IN DEXTROSE 0.75-8.25 % IT SOLN
INTRATHECAL | Status: DC | PRN
  Administered 2011-04-02: 1.5 mL via INTRATHECAL

## 2011-04-02 MED ORDER — LACTATED RINGERS IV SOLN
INTRAVENOUS | Status: DC
  Administered 2011-04-02 (×3): via INTRAVENOUS

## 2011-04-02 MED ORDER — KETOROLAC TROMETHAMINE 60 MG/2ML IM SOLN
60.0000 mg | Freq: Once | INTRAMUSCULAR | Status: AC | PRN
  Administered 2011-04-02: 60 mg via INTRAMUSCULAR

## 2011-04-02 MED ORDER — OXYTOCIN 10 UNIT/ML IJ SOLN
INTRAMUSCULAR | Status: AC
  Filled 2011-04-02: qty 4

## 2011-04-02 MED ORDER — IBUPROFEN 600 MG PO TABS
600.0000 mg | ORAL_TABLET | Freq: Four times a day (QID) | ORAL | Status: DC
  Administered 2011-04-02 – 2011-04-06 (×14): 600 mg via ORAL
  Filled 2011-04-02 (×6): qty 1

## 2011-04-02 MED ORDER — PROPOFOL 10 MG/ML IV EMUL
INTRAVENOUS | Status: AC
  Filled 2011-04-02: qty 20

## 2011-04-02 MED ORDER — ONDANSETRON HCL 4 MG/2ML IJ SOLN
INTRAMUSCULAR | Status: DC | PRN
  Administered 2011-04-02: 4 mg via INTRAVENOUS

## 2011-04-02 MED ORDER — FENTANYL CITRATE 0.05 MG/ML IJ SOLN
INTRAMUSCULAR | Status: AC
  Filled 2011-04-02: qty 10

## 2011-04-02 MED ORDER — MENTHOL 3 MG MT LOZG: 1.0000 | LOZENGE | OROMUCOSAL | Status: DC | PRN

## 2011-04-02 MED ORDER — LABETALOL HCL 5 MG/ML IV SOLN
10.0000 mg | Freq: Once | INTRAVENOUS | Status: AC
  Administered 2011-04-02: 10 mg via INTRAVENOUS
  Filled 2011-04-02: qty 4

## 2011-04-02 MED ORDER — WITCH HAZEL-GLYCERIN EX PADS: 1.0000 "application " | MEDICATED_PAD | CUTANEOUS | Status: DC | PRN

## 2011-04-02 MED ORDER — ONDANSETRON HCL 4 MG/2ML IJ SOLN: 4.0000 mg | Freq: Three times a day (TID) | INTRAMUSCULAR | Status: DC | PRN

## 2011-04-02 MED ORDER — DIPHENHYDRAMINE HCL 12.5 MG/5ML PO ELIX
12.5000 mg | ORAL_SOLUTION | Freq: Four times a day (QID) | ORAL | Status: DC | PRN
  Filled 2011-04-02: qty 5

## 2011-04-02 MED ORDER — NIFEDIPINE ER 30 MG PO TB24
30.0000 mg | ORAL_TABLET | Freq: Two times a day (BID) | ORAL | Status: DC
  Administered 2011-04-02: 30 mg via ORAL
  Filled 2011-04-02 (×2): qty 1

## 2011-04-02 MED ORDER — SODIUM CHLORIDE 0.9 % IJ SOLN
3.0000 mL | INTRAMUSCULAR | Status: DC | PRN
  Administered 2011-04-04: 3 mL via INTRAVENOUS

## 2011-04-02 MED ORDER — HYDROMORPHONE HCL 1 MG/ML IJ SOLN
0.2500 mg | INTRAMUSCULAR | Status: DC | PRN
  Administered 2011-04-02 (×3): 0.5 mg via INTRAVENOUS

## 2011-04-02 MED ORDER — METOCLOPRAMIDE HCL 5 MG/ML IJ SOLN: 10.0000 mg | Freq: Three times a day (TID) | INTRAMUSCULAR | Status: DC | PRN

## 2011-04-02 MED ORDER — KETOROLAC TROMETHAMINE 60 MG/2ML IM SOLN
INTRAMUSCULAR | Status: AC
  Administered 2011-04-02: 60 mg via INTRAMUSCULAR
  Filled 2011-04-02: qty 2

## 2011-04-02 MED ORDER — MIDAZOLAM HCL 2 MG/2ML IJ SOLN
INTRAMUSCULAR | Status: AC
  Filled 2011-04-02: qty 2

## 2011-04-02 MED ORDER — ONDANSETRON HCL 4 MG/2ML IJ SOLN
INTRAMUSCULAR | Status: AC
  Filled 2011-04-02: qty 2

## 2011-04-02 MED ORDER — MORPHINE SULFATE (PF) 0.5 MG/ML IJ SOLN
INTRAMUSCULAR | Status: DC | PRN
  Administered 2011-04-02: .1 mg via EPIDURAL

## 2011-04-02 MED ORDER — LABETALOL HCL 5 MG/ML IV SOLN
10.0000 mg | Freq: Once | INTRAVENOUS | Status: AC
  Administered 2011-04-02: 20 mg via INTRAVENOUS

## 2011-04-02 MED ORDER — SODIUM CHLORIDE 0.9 % IV SOLN
1.0000 ug/kg/h | INTRAVENOUS | Status: DC | PRN
  Filled 2011-04-02: qty 2.5

## 2011-04-02 MED ORDER — ONDANSETRON HCL 4 MG PO TABS: 4.0000 mg | ORAL_TABLET | ORAL | Status: DC | PRN

## 2011-04-02 MED ORDER — MEPERIDINE HCL 25 MG/ML IJ SOLN: 6.2500 mg | INTRAMUSCULAR | Status: DC | PRN

## 2011-04-02 SURGICAL SUPPLY — 32 items
CLOTH BEACON ORANGE TIMEOUT ST (SAFETY) ×2 IMPLANT
CONTAINER PREFILL 10% NBF 15ML (MISCELLANEOUS) IMPLANT
DRESSING TELFA 8X3 (GAUZE/BANDAGES/DRESSINGS) IMPLANT
DRSG VASELINE 3X18 (GAUZE/BANDAGES/DRESSINGS) IMPLANT
ELECT REM PT RETURN 9FT ADLT (ELECTROSURGICAL) ×2
ELECTRODE REM PT RTRN 9FT ADLT (ELECTROSURGICAL) ×1 IMPLANT
EXTRACTOR VACUUM KIWI (MISCELLANEOUS) IMPLANT
EXTRACTOR VACUUM M CUP 4 TUBE (SUCTIONS) IMPLANT
GAUZE SPONGE 4X4 12PLY STRL LF (GAUZE/BANDAGES/DRESSINGS) ×2 IMPLANT
GAUZE VASELINE 1X8 (GAUZE/BANDAGES/DRESSINGS) ×2 IMPLANT
GLOVE BIO SURGEON STRL SZ7.5 (GLOVE) ×4 IMPLANT
GOWN PREVENTION PLUS LG XLONG (DISPOSABLE) ×4 IMPLANT
GOWN PREVENTION PLUS XLARGE (GOWN DISPOSABLE) ×2 IMPLANT
KIT ABG SYR 3ML LUER SLIP (SYRINGE) IMPLANT
NEEDLE HYPO 25X5/8 SAFETYGLIDE (NEEDLE) ×2 IMPLANT
NS IRRIG 1000ML POUR BTL (IV SOLUTION) ×2 IMPLANT
PACK C SECTION WH (CUSTOM PROCEDURE TRAY) ×2 IMPLANT
PAD ABD 7.5X8 STRL (GAUZE/BANDAGES/DRESSINGS) IMPLANT
RTRCTR C-SECT PINK 25CM LRG (MISCELLANEOUS) ×2 IMPLANT
SLEEVE SCD COMPRESS KNEE MED (MISCELLANEOUS) ×2 IMPLANT
STAPLER VISISTAT 35W (STAPLE) ×2 IMPLANT
SUT PLAIN 0 NONE (SUTURE) IMPLANT
SUT VIC AB 0 CT1 36 (SUTURE) ×16 IMPLANT
SUT VIC AB 3-0 CTX 36 (SUTURE) ×2 IMPLANT
SUT VIC AB 3-0 SH 27 (SUTURE) ×4
SUT VIC AB 3-0 SH 27X BRD (SUTURE) IMPLANT
SUT VIC AB 3-0 SH 27XBRD (SUTURE) ×2 IMPLANT
SUT VIC AB 4-0 KS 27 (SUTURE) IMPLANT
SUT VICRYL 0 TIES 12 18 (SUTURE) ×2 IMPLANT
TOWEL OR 17X24 6PK STRL BLUE (TOWEL DISPOSABLE) ×4 IMPLANT
TRAY FOLEY CATH 14FR (SET/KITS/TRAYS/PACK) ×4 IMPLANT
WATER STERILE IRR 1000ML POUR (IV SOLUTION) IMPLANT

## 2011-04-02 NOTE — Progress Notes (Signed)
04/02/11 @ 1030 am: Spoke with Dr. Ambrose Mantle regarding increased BP in patient. Orders were received for labetalol 10 mg IVP; urinalysis; urine 24 hr protein and creatinine clearance; Morphine PCA ordered for uncontrolled pain.   1400: Spoke with Dr. Senaida Ores regarding decreased urine output (~90 ml over 4 hours). Labs ordered; 250 ml LR bolus given per order.   Continue to monitor and follow up with Dr. Senaida Ores with lab results.   Forrest Moron, RN

## 2011-04-02 NOTE — Progress Notes (Signed)
Subjective: Postpartum Day DOD: Cesarean Delivery Patient reports tolerating PO.  Pain now controlled with PCA.  No HA or epigastric pain. Pt had diminished UOP of 75 cc for a few hours after C-section but now has started putting out 600cc in last few hours.  BP diastolics aat 90-100. Urine sent when output low showed 300 of protein, but was concentrated so will repeat now. Boyfriend taken away from hospital in handcuffs today.   Objective: Vital signs in last 24 hours: Temp:  [97.1 F (36.2 C)-99 F (37.2 C)] 99 F (37.2 C) (10/29 1743) Pulse Rate:  [65-88] 80  (10/29 1743) Resp:  [12-20] 16  (10/29 1800) BP: (129-169)/(88-114) 159/100 mmHg (10/29 1743) SpO2:  [96 %-99 %] 97 % (10/29 1800) Weight:  [65.318 kg (144 lb)] 144 lb (65.318 kg) (10/29 0535)  Physical Exam:  General: alert Lochia: appropriate Uterine Fundus: firm Incision: CDI    Basename 04/02/11 1608 04/02/11 0620  HGB 9.3* 11.1*  HCT 29.1* 34.6*    Assessment/Plan: Status post Cesarean section with elevated BP and nml PIH labs repeated this PM.  UOP greatly improved, will recheck UA for protein now and assess if pt needs magnesium prophylaxis. Oliver Pila 04/02/2011, 7:03 PM

## 2011-04-02 NOTE — Anesthesia Preprocedure Evaluation (Addendum)
Anesthesia Evaluation  Patient identified by MRN, date of birth, ID band Patient awake  General Assessment Comment  Reviewed: Allergy & Precautions, H&P , NPO status , Patient's Chart, lab work & pertinent test results, reviewed documented beta blocker date and time   History of Anesthesia Complications (+) DIFFICULT IV STICK / SPECIAL LINE  Airway Mallampati: II TM Distance: >3 FB Neck ROM: full    Dental  (+) Teeth Intact   Pulmonary  clear to auscultation        Cardiovascular hypertension (history of severe preeclampsia in 1st pregnancy), regular Normal    Neuro/Psych Negative Neurological ROS  Negative Psych ROS   GI/Hepatic negative GI ROS Neg liver ROS    Endo/Other  Negative Endocrine ROS  Renal/GU negative Renal ROS     Musculoskeletal   Abdominal   Peds  Hematology negative hematology ROS (+)   Anesthesia Other Findings Patient states that she has had two C-sections, one done at this facility and one in [place].  Both times a spinal was attempted, but was unsuccessful.  Both times she received general anesthesia.  She prefers GA - risks of GA discussed.  Informed patient that SAB is safer for her and baby, will make brief attempt and if unsuccessful proceed with GA.  Reproductive/Obstetrics (+) Pregnancy                         Anesthesia Physical Anesthesia Plan  ASA: II and Emergent  Anesthesia Plan: Spinal   Post-op Pain Management:    Induction:   Airway Management Planned:   Additional Equipment:   Intra-op Plan:   Post-operative Plan:   Informed Consent: I have reviewed the patients History and Physical, chart, labs and discussed the procedure including the risks, benefits and alternatives for the proposed anesthesia with the patient or authorized representative who has indicated his/her understanding and acceptance.     Plan Discussed with: CRNA and  Surgeon  Anesthesia Plan Comments:         Anesthesia Quick Evaluation

## 2011-04-02 NOTE — Progress Notes (Signed)
Pt reports "really bad contractions", denies ROM, some spotting. Denies problems with preg. Pt is repeat C/S (hitory of C/S x 2)

## 2011-04-02 NOTE — Op Note (Signed)
NAMECAMIE, HAUSS            ACCOUNT NO.:  [id number]  MEDICAL RECORD NO.:  [id number]  LOCATION:  [facility]                          FACILITY:  [facility]  PHYSICIAN:  Malachi Pro. Ambrose Mantle, M.D. DATE OF BIRTH:  Feb 21, 1986  DATE OF PROCEDURE: DATE OF DISCHARGE:                              OPERATIVE REPORT   PREOPERATIVE DIAGNOSIS:  Intrauterine pregnancy at 39 weeks, prior cesarean section x2, active labor, declined vaginal birth after cesarean.  POSTOPERATIVE DIAGNOSIS:  Intrauterine pregnancy at 39 weeks, prior cesarean section x2, active labor, declined vaginal birth after cesarean.  OPERATION:  Low-transverse cervical cesarean section.  OPERATOR:  Malachi Pro. Ambrose Mantle, MD  ANESTHESIA:  Spinal anesthesia by Dr. Rodman Pickle.  DESCRIPTION OF PROCEDURE:  The patient was brought to the operating room.  Fetal heart tones were confirmed to be normal.  The patient was placed in frog-leg position.  After spinal anesthetic by Dr. Rodman Pickle, the abdomen and urethra were prepped with Betadine solution.  A Foley catheter was inserted to drain.  The patient was taken out a frogleg. The abdomen was draped as a sterile field.  Anesthesia was confirmed and the old incision which was more to the left than the right was used to go in layers through the skin, subcutaneous tissue, and fascia.  I tried to make a skin incision more to the right, and then utilizing entire length on the left.  I incised the fascia and separated it from the rectus muscles superiorly and inferiorly, although the rectus muscle was hardly present.  I then entered the peritoneal cavity.  Exposed the lower uterine segment with Alexis retractor and made a small incision through the superficial portion of the myometrium in the lower uterine segment transversely and then pulled superiorly and inferiorly and as up pull I could tell that the incision extended little bit inferiorly. The baby was in occiput transverse position.  I delivered the  head through the incisional opening, then suctioned the baby's nose, but quickly delivered the shoulders and the rest of the body, clamped the cord, and gave the infant to Dr. Ruben Gottron, who was in attendance.  He assigned the female infant, Apgars of 9 and 9 at 1 and 5 minutes.  Cord blood was obtained.  The placenta was removed.  The inside of the uterus was normal.  Because the uterus incision seemed to extend a little bit in the midline inferiorly and the lower part of the uterus was quite thin. I took the time to try to reconstruct the closure with a running suture of 0 Vicryl on the first layer locking yet and a nonlocking suture of the same material on the second layer.  I used interrupted figure-of- eight sutures of 3-0 Vicryl to obtain complete hemostasis.  I liberally irrigated to confirm hemostasis, checked the uterus, tubes, and ovaries, all of which appeared normal.  I liberally irrigated both gutters and then closed the abdominal wall with a running suture of 0 Vicryl on the peritoneum, there was almost no rectus muscle to reapproximate.  The fascia was also somewhat difficult to reconstruct, I closed it with 2 running sutures of 0 Vicryl.  The subcu with a running 3-0 Vicryl.  Skin was  closed with automatic staples.  The patient seemed to tolerate the procedure well.  At the end of the procedure, the patient had put out very little urine.  It was clear and yellow, so I removed the catheter and put another catheter then, but there was no urine in that, so watch the urine output very carefully.  BLOOD LOSS:  Estimated somewhere around 1000 mL.  SPONGE AND NEEDLE COUNTS:  Correct.     Malachi Pro. Ambrose Mantle, M.D.     TFH/MEDQ  D:  04/02/2011  T:  04/02/2011  Job:  [id number]

## 2011-04-02 NOTE — Transfer of Care (Signed)
Immediate Anesthesia Transfer of Care Note  Patient: Kathy Reed  Procedure(s) Performed:  CESAREAN SECTION - Repeat of baby boy at [facility] 9/9  Patient Location: PACU  Anesthesia Type: Spinal  Level of Consciousness: awake, alert  and oriented  Airway & Oxygen Therapy: Patient Spontanous Breathing  Post-op Assessment: Report given to PACU RN and Post -op Vital signs reviewed and stable  Post vital signs: Reviewed and stable  Complications: No apparent anesthesia complications

## 2011-04-02 NOTE — Anesthesia Postprocedure Evaluation (Signed)
  Anesthesia Post-op Note  Patient: Kathy Reed  Procedure(s) Performed:  CESAREAN SECTION - Repeat of baby boy at [facility] 9/9  Patient Location: PACU and Mother/Baby  Anesthesia Type: Spinal  Level of Consciousness: awake, alert , oriented and patient cooperative  Airway and Oxygen Therapy: Patient Spontanous Breathing  Post-op Pain: none  Post-op Assessment: Post-op Vital signs reviewed and Patient's Cardiovascular Status Stable  Post-op Vital Signs: Reviewed and stable  Complications: No apparent anesthesia complications

## 2011-04-02 NOTE — H&P (Signed)
NAME:  ,                                 ACCOUNT NO.:  MEDICAL RECORD NO.:  [id number]  LOCATION:                                 FACILITY:  PHYSICIAN:  Malachi Pro. Ambrose Mantle, M.D.      DATE OF BIRTH:  DATE OF ADMISSION:  04/02/2011 DATE OF DISCHARGE:                             HISTORY & PHYSICAL   HISTORY OF PRESENT ILLNESS:  This is a 25 year old white female, para 1- 1-2-2, gravida 5 with [facility] April 10, 2011, who is admitted in labor with 2 prior C-sections and requests to proceed with a C-section.  The patient has B positive blood group and type with a negative antibody, rubella immune, RPR nonreactive, urine culture negative, hepatitis B surface antigen negative, HIV negative, GC and Chlamydia negative. Cystic fibrosis screen in 2007 negative.  First trimester screen negative.  She did have an ultrasound at 7 weeks and 2 days, which gave her due date as April 11, 2011.  Her 1-hour Glucola was 88.  Group B strep was negative.  The patient had an ultrasound on November 16, 2010, that showed 19 weeks and 2 days, [facility] April 10, 2011.  PRENATAL COURSE:  Her prenatal course began at 14 weeks.  She was treated with metronidazole for BV at 15 weeks.  She had trouble making her visits due to finances.  She thought that she might transfer to [facility] during the pregnancy, but elected to stay with our practice.  She had some back pain that was treated with Flexeril.  She has had no significant problems.  At about 1:30 a.m. on the day of admission, she began contracting.  Pains have been significant. She presents to the maternity admission unit 3-1/2 cm dilated with painful contractions. She states she does not want a trial of labor.  ALLERGIES:  NO KNOWN ALLERGIES.  MEDICATIONS:  No medications at the present time.  PAST MEDICAL HISTORY: 1. 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 2. She has a history of scoliosis.  PAST SURGICAL HISTORY:  She has had 2 C sections, a D  and E for missed abortion in 2011.  She has had knee surgery with screws in both knees. She has had a sling by urologist in 2010, tonsillectomy, and wisdom teeth extraction.  SOCIAL HISTORY:  The patient says she has a 2-year college degree.  She denies alcohol, tobacco, and substance abuse.  She is married.  FAMILY HISTORY:  Negative.  OBSTETRIC HISTORY:  The patient did have a nonviable pregnancy removed via D and E.  She has had another spontaneous abortion.  She had a cesarean section in 2005 for 4 pounds 10 ounce female.  She had preeclampsia and in 2008 she had a C-section and had postpartum endometritis.  PHYSICAL EXAMINATION:  VITAL SIGNS:  Her blood pressure is high at 153/103, the pulse is 80.  She is afebrile.  HEART:  Normal size and sounds.  No murmurs. LUNGS:  Clear to auscultation.  The.  ABDOMEN:  Soft.  At her last prenatal visit that I have on March 20, 2011, her fundal height was 35- 1/2 cm, the cervix per the nurse 3-1/2 cm, vertex at a -2.  ADMITTING IMPRESSION:  Intrauterine pregnancy at 39 weeks.  Two prior cesarean sections.  Active labor.  Declines vaginal birth after cesarean.  She is prepared for cesarean section.  She declines to have a tubal ligation.     Malachi Pro. Ambrose Mantle, M.D.     TFH/MEDQ  D:  04/02/2011  T:  04/02/2011  Job:  [id number]

## 2011-04-02 NOTE — Anesthesia Procedure Notes (Signed)
Spinal Block  Patient location during procedure: OR Start time: 04/02/2011 6:50 AM Staffing Performed by: anesthesiologist  Preanesthetic Checklist Completed: patient identified, site marked, surgical consent, pre-op evaluation, timeout performed, IV checked, risks and benefits discussed and monitors and equipment checked Spinal Block Patient position: sitting Prep: site prepped and draped and DuraPrep Patient monitoring: heart rate, cardiac monitor, continuous pulse ox and blood pressure Approach: midline Location: L3-4 Injection technique: single-shot Needle Needle type: Sprotte  Needle gauge: 24 G Needle length: 9 cm Assessment Sensory level: T4 Additional Notes Clear free flow CSF on first attempt - patient tolerated well.

## 2011-04-02 NOTE — Progress Notes (Signed)
Spoke with Dr. Senaida Ores regarding lab results. States she will round on patient by 1900 pm. Will continue monitoring.  Forrest Moron, RN

## 2011-04-02 NOTE — Anesthesia Postprocedure Evaluation (Signed)
  Anesthesia Post-op Note  Patient: Kathy Reed  Procedure(s) Performed:  CESAREAN SECTION - Repeat of baby boy at [facility] 9/9  Patient Location: PACU  Anesthesia Type: Spinal  Level of Consciousness: awake, alert  and oriented  Airway and Oxygen Therapy: Patient Spontanous Breathing  Post-op Pain: none  Post-op Assessment: Post-op Vital signs reviewed, Patient's Cardiovascular Status Stable, Respiratory Function Stable, Patent Airway, No signs of Nausea or vomiting, Adequate PO intake, Pain level controlled, No headache, No backache, No residual numbness and No residual motor weakness  Post-op Vital Signs: Reviewed and stable  Complications: No apparent anesthesia complications

## 2011-04-02 NOTE — Addendum Note (Signed)
Addendum  created 04/02/11 1549 by Marrion Coy   Modules edited:Notes Section

## 2011-04-02 NOTE — Progress Notes (Signed)
  Urine protein negative, will hold off on Magnesium and watch BP.

## 2011-04-02 NOTE — Progress Notes (Signed)
DR, Olegario Messier RICHARDSON PHONED AND NOTIFIED OF B/P 165/107,NO CLONUS,NO REFLEXES,HX BIL. KNEE SURGERY, URINE PROTEIN NEGATIVE. ORDERS RECEIVED TO START PROCARDIA XL. PT. INFORMED PER MD REQUEST.

## 2011-04-02 NOTE — Progress Notes (Signed)
Dr. Huel Cote PG'D AND ASKED FOR RETURN CALL TO [phone] REGARDING B/P 170/119

## 2011-04-03 ENCOUNTER — Encounter (HOSPITAL_COMMUNITY): Payer: Self-pay | Admitting: Obstetrics and Gynecology

## 2011-04-03 ENCOUNTER — Inpatient Hospital Stay (HOSPITAL_COMMUNITY): Admission: RE | Admit: 2011-04-03 | Payer: Medicaid Other | Source: Ambulatory Visit

## 2011-04-03 LAB — CBC
HCT: 30.2 % — ABNORMAL LOW (ref 36.0–46.0)
Hemoglobin: 9.6 g/dL — ABNORMAL LOW (ref 12.0–15.0)
WBC: 12.2 10*3/uL — ABNORMAL HIGH (ref 4.0–10.5)

## 2011-04-03 LAB — RAPID URINE DRUG SCREEN, HOSP PERFORMED
Amphetamines: NOT DETECTED
Barbiturates: NOT DETECTED
Benzodiazepines: NOT DETECTED

## 2011-04-03 LAB — MRSA PCR SCREENING: MRSA by PCR: NEGATIVE

## 2011-04-03 MED ORDER — GENTAMICIN SULFATE 40 MG/ML IJ SOLN
Freq: Three times a day (TID) | INTRAMUSCULAR | Status: DC
  Administered 2011-04-03 – 2011-04-05 (×6): via INTRAVENOUS
  Filled 2011-04-03 (×7): qty 3

## 2011-04-03 MED ORDER — NIFEDIPINE ER 60 MG PO TB24
60.0000 mg | ORAL_TABLET | Freq: Every day | ORAL | Status: DC
  Administered 2011-04-04: 60 mg via ORAL
  Filled 2011-04-03 (×3): qty 1

## 2011-04-03 MED ORDER — LABETALOL HCL 5 MG/ML IV SOLN
10.0000 mg | Freq: Once | INTRAVENOUS | Status: AC
  Administered 2011-04-03: 10 mg via INTRAVENOUS
  Filled 2011-04-03: qty 4

## 2011-04-03 MED ORDER — LABETALOL HCL 5 MG/ML IV SOLN
10.0000 mg | INTRAVENOUS | Status: DC | PRN
  Filled 2011-04-03: qty 4

## 2011-04-03 MED ORDER — NIFEDIPINE ER 60 MG PO TB24
60.0000 mg | ORAL_TABLET | Freq: Two times a day (BID) | ORAL | Status: DC
  Administered 2011-04-03: 60 mg via ORAL
  Filled 2011-04-03 (×4): qty 1

## 2011-04-03 MED ORDER — GENTAMICIN SULFATE 40 MG/ML IJ SOLN: Freq: Three times a day (TID) | INTRAVENOUS | Status: DC

## 2011-04-03 NOTE — Progress Notes (Signed)
Pt discussed with Dr. Ambrose Mantle who wants patient transferred to [facility] for more intensive monitoring of her BP.  He will complete her orders at his earliest convenience.  I have entered transfer and antihypertensive meds as well as antibiotics for her continued care until he can complete her documentation.

## 2011-04-03 NOTE — Progress Notes (Signed)
#  1 afebrile this AM Temp max 100.1 Output much improved BP better Will continue to observe.HGB stable.

## 2011-04-03 NOTE — Progress Notes (Signed)
UR Chart review completed.  

## 2011-04-03 NOTE — Progress Notes (Signed)
PSYCHOSOCIAL ASSESSMENT ~ MATERNAL/CHILD  Name: Kathy Reed Age: 25  Referral Date: 10 / 30 / 12  Reason/Source: Social situation/ CN  I. FAMILY/HOME ENVIRONMENT  A. Child's Legal Guardian _X__Parent(s) ___Grandparent ___Foster parent ___DSS_________________  Name: Kathy Reed DOB: // Age: 25  Address: [address] ; [place], [place] [address]  Name: Kathy Reed DOB: // Age: 21  Address:  B. Other Household Members/Support Persons Name: Relationship: DOB ___/___/___  Name: Relationship: DOB ___/___/___  Name: Relationship: DOB ___/___/___  Name: Relationship: DOB ___/___/___  C. Other Support:  II. PSYCHOSOCIAL DATA A. Information Source _X_Patient Interview __Family Interview __Other___________ B. Financial and Community Resources __Employment:  _X_Medicaid County: [place] __Private Insurance: __Self Pay  _X_Food Stamps _X_WIC __Work First __Public Housing __Section 8  __Maternity Care Coordination/Child Service Coordination/Early Intervention  ___School: Grade:  __Other:  C. Cultural and Environment Information Cultural Issues Impacting Care:  III. STRENGTHS _X__Supportive family/friends  _X__Adequate Resources  ___Compliance with medical plan  _X__Home prepared for Child (including basic supplies)  ___Understanding of illness  ___Other:  RISK FACTORS AND CURRENT PROBLEMS __X__No Problems Noted  IV. SOCIAL WORK ASSESSMENT Sw met with the pt to assess the current social situation re: FOB being arrested in hospital room. Pt lives with FOB and his mother. She has 2 other children that live with her mother due to financial reasons. Pt denies any CPS involvement and continues to maintain contact with the children. Pt told Sw that her spouse was arrested yesterday due to "failue to appear" in court. She explained that he was charged with aiding and abetting and contributing to the delinquency of a minor. She denies any domestic violence between them. Pt plans to bond her  husband out of jail once she gets the money. Sw asked pt about suspected substance use (as per RN) however pt denies. She reports that she has supplies for the infant and adequate support. Pt appears to be appropriate and not in need of Sw resources. Sw will assist further if needed.  V. SOCIAL WORK PLAN __X_No Further Intervention Required/No Barriers to Discharge  ___Psychosocial Support and Ongoing Assessment of Needs  ___Patient/Family Education:  ___Child Protective Services Report County___________ Date___/____/____  ___Information/Referral to Community Resources_________________________  ___Other:         _X_Patient Interview  __Family Interview           __Other___________  B. Surveyor, quantity and [employer] __Employment: _X_Medicaid    [place]:  [place]               __Private Insurance:                   __Self Pay  _X_Food Stamps   _X_WIC __Work First     __Public Housing     __Section 8    __Maternity Care Coordination/Child Service Coordination/Early Intervention   ___School:                                                                         Grade:  __Other:   Salena Saner Cultural and Environment Information Cultural Issues Impacting Care:  III. STRENGTHS _X__Supportive family/friends _X__Adequate Resources ___Compliance with medical plan _X__Home prepared for  Child (including basic supplies) ___Understanding of illness      ___Other: RISK FACTORS AND CURRENT PROBLEMS         __X__No Problems Noted                                                                                                                                                                                                                                        IV. SOCIAL WORK ASSESSMENT   Sw met with the pt to assess the current social situation re: FOB being arrested in hospital room.  Pt lives with FOB and his mother.  She has 2 other children that live with her mother due to financial reasons.  Pt denies any CPS involvement and continues to maintain contact with the children.  Pt told Sw that her spouse was arrested yesterday due to "failue to appear" in court.  She explained that he was charged with aiding and abetting and contributing to the delinquency of a minor.  She denies any domestic violence between them.  Pt plans to bond her husband out of jail once she gets the money.  Sw asked pt about suspected substance use (as per RN) however pt denies.  She reports that she has supplies  for the infant and adequate support.  Pt appears to be appropriate and not in need of Sw resources.  Sw will assist further if needed.       V. SOCIAL WORK PLAN  __X_No Further Intervention Required/No Barriers to Discharge   ___Psychosocial Support and Ongoing Assessment of Needs   ___Patient/Family Education:   ___Child Protective Services Report   County___________ Date___/____/____   ___Information/Referral to [employer] Resources_________________________   ___Other:

## 2011-04-03 NOTE — Progress Notes (Addendum)
Pt admitted to AICU via W/C for more intense monitoring of BP and pain management.  BP=148/97 upon admission. Pt denies PIH S&S, but does report a h/o with a previous pregnancy.  24 hour urine remians in progress until 2330 tonight. Assessment unremarkable X for abdominal distention noted with + bowel sounds and -flatus. Pt. remians on PCA Dilaudid and has not ambulated. POC discussed and pt verbalized understanding. Tia Alert A

## 2011-04-04 MED ORDER — MAGNESIUM SULFATE 40 G IN LACTATED RINGERS - SIMPLE: 2.0000 g | INTRAVENOUS | Status: DC

## 2011-04-04 MED ORDER — LABETALOL HCL 200 MG PO TABS
200.0000 mg | ORAL_TABLET | Freq: Two times a day (BID) | ORAL | Status: DC
  Administered 2011-04-05 (×2): 200 mg via ORAL
  Filled 2011-04-04 (×3): qty 1

## 2011-04-04 MED ORDER — OXYCODONE HCL 5 MG PO TABS
5.0000 mg | ORAL_TABLET | ORAL | Status: DC | PRN
  Administered 2011-04-04 – 2011-04-06 (×10): 5 mg via ORAL
  Filled 2011-04-04 (×10): qty 1

## 2011-04-04 MED ORDER — MAGNESIUM SULFATE 40 G IN LACTATED RINGERS - SIMPLE: 4.0000 g | INTRAVENOUS | Status: DC

## 2011-04-04 MED ORDER — MAGNESIUM SULFATE 40 G IN LACTATED RINGERS - SIMPLE
2.0000 g/h | INTRAVENOUS | Status: DC
  Administered 2011-04-04: 2 g/h via INTRAVENOUS
  Filled 2011-04-04 (×2): qty 500

## 2011-04-04 MED ORDER — MAGNESIUM SULFATE BOLUS VIA INFUSION
4.0000 g | Freq: Once | INTRAVENOUS | Status: AC
  Administered 2011-04-04: 4 g via INTRAVENOUS
  Filled 2011-04-04: qty 500

## 2011-04-04 NOTE — Progress Notes (Signed)
BP 109/60 to 160/93 over the last 12 hours urine protein 828 mg in 24 hours. PT c/o headache Will treat with magnesium sulfate

## 2011-04-04 NOTE — Progress Notes (Signed)
#  2 afebrile BP 117/90 to 148/97. T max 98.1 Passing flatus but some distention of the abdomen. It is not tender. Tolerating a regular diet. 24 hour urine not back yet. Continue present regimen of procardia XL 60 mg qd and clindamycin and gentamycin.Marland Kitchen

## 2011-04-05 ENCOUNTER — Inpatient Hospital Stay (HOSPITAL_COMMUNITY)
Admission: RE | Admit: 2011-04-05 | Payer: Medicaid Other | Source: Ambulatory Visit | Admitting: Obstetrics and Gynecology

## 2011-04-05 ENCOUNTER — Encounter (HOSPITAL_COMMUNITY): Admission: RE | Payer: Self-pay | Source: Ambulatory Visit

## 2011-04-05 LAB — PROTEIN, URINE, 24 HOUR
Collection Interval-UPROT: 24 hours
Protein, 24H Urine: 828 mg/d — ABNORMAL HIGH (ref 50–100)
Protein, Urine: 18 mg/dL
Urine Total Volume-UPROT: 4600 mL

## 2011-04-05 SURGERY — Surgical Case
Anesthesia: Regional

## 2011-04-05 MED ORDER — LACTATED RINGERS IV SOLN: INTRAVENOUS | Status: DC

## 2011-04-05 MED ORDER — MAGNESIUM SULFATE 40 G IN LACTATED RINGERS - SIMPLE
2.0000 g/h | INTRAVENOUS | Status: AC
  Administered 2011-04-05: 2 g/h via INTRAVENOUS

## 2011-04-05 NOTE — Progress Notes (Signed)
Spiritual Care - Received request to visit with patient.  She was holding her baby and is very pleased with him. She is very emotional regarding her husband's arrest shortly after her delivery.  She has not talked with him since.  The arrest was expected but not right after delivery.  She is tearful.  Her other two children live with her mother, and she has been living with her mother-in-law.  She will go there when discharged.    Her major request of me was for advanced directives which I have obtained for her.  As best I understand she is not as interested in completing these while in the hospital but wants to review them and make plans when she goes home. Will follow.  Dory Horn, Chaplain

## 2011-04-05 NOTE — Progress Notes (Signed)
UR chart review completed.  

## 2011-04-05 NOTE — Progress Notes (Signed)
  Pt states HA is improving, minimal.  Great UOP1500cc/shift.  BP's 140's/90's on po labetolol 200mg  BID. Will d/c magnesium this pm at 2200 after 24 hours of use and follow BP on labetolol.

## 2011-04-05 NOTE — Progress Notes (Signed)
#   3 temp max 99.5 Still has some headache although slightly improved Passing flatus Will use MAG so4 at least 24 hours. Abdomen soft but distended and not tender. I stopped the procardia and have begun labatolol.

## 2011-04-06 MED ORDER — OXYCODONE HCL 5 MG PO TABS: 5.0000 mg | ORAL_TABLET | ORAL | Status: AC | PRN

## 2011-04-06 MED ORDER — IBUPROFEN 600 MG PO TABS: 600.0000 mg | ORAL_TABLET | Freq: Four times a day (QID) | ORAL | Status: AC | PRN

## 2011-04-06 MED ORDER — NIFEDIPINE ER 60 MG PO TB24
60.0000 mg | ORAL_TABLET | Freq: Every day | ORAL | Status: AC
  Administered 2011-04-06: 60 mg via ORAL

## 2011-04-06 MED ORDER — NIFEDIPINE ER 60 MG PO TB24
60.0000 mg | ORAL_TABLET | Freq: Every day | ORAL | Status: DC
  Filled 2011-04-06: qty 1

## 2011-04-06 MED ORDER — NIFEDIPINE ER OSMOTIC RELEASE 60 MG PO TB24: 60.0000 mg | ORAL_TABLET | Freq: Every day | ORAL | Status: DC

## 2011-04-06 MED ORDER — NIFEDIPINE ER 60 MG PO TB24: 60.0000 mg | ORAL_TABLET | Freq: Every day | ORAL | Status: DC

## 2011-04-06 NOTE — Progress Notes (Signed)
#  4 afebrile BP acceptable Headache is gone. Tolerating a regular diet Ambulating well For D/C.

## 2011-04-06 NOTE — Progress Notes (Signed)
Spiritual Care - Brief visit w/patient.  She had two visitors.  Gave her numbers of [facility] as possible chaplain resource there and also number at [facility] in [place] - these are numbers she had requested for use in trying to find resource to reach her husband.  Kathy Reed

## 2011-04-06 NOTE — Discharge Summary (Signed)
Kathy Reed, Kathy Reed            ACCOUNT NO.:  [id number]  MEDICAL RECORD NO.:  [id number]  LOCATION:  9373                          FACILITY:  [facility]  PHYSICIAN:  Malachi Pro. Ambrose Mantle, M.D. DATE OF BIRTH:  [birth year]  DATE OF ADMISSION:  04/02/2011 DATE OF DISCHARGE:  04/06/2011                              DISCHARGE SUMMARY   This is a 25 year old white female, para 1-1-2-2, gravida 5, EDC April 10, 2011, admitted in labor with 2 prior C-sections and requested to proceed with C-section.  B positive, negative antibody, rubella immune, RPR nonreactive, urine culture negative.  Hepatitis B surface antigen negative, HIV negative, GC and Chlamydia negative. Cystic fibrosis screen in 2007, was negative.  First trimester screen negative, 1-hour Glucola was 88, group B strep was negative.  The patient was admitted in labor.  She wanted to proceed with C-section and she underwent a low-transverse cervical C-section by Dr. Ambrose Mantle under spinal anesthesia.  The patient was admitted with very high blood pressure.  She had not exhibited high blood pressure or proteinuria in the office, and it was uncertain whether she had preeclampsia or just pregnancy-induced high blood pressure.  A 24-hour urine was begun, but one specimen was discarded, so it had to be restarted and we when finally got the result back, the urine protein was 828 mg in 24 hours. This came back on the April 04, 2011, at 8:43 p.m.  At that time, she was complaining of abdominal pain and headache.  She was started on magnesium sulfate and the magnesium sulfate was continued for 24 hours, her headache disappeared.  Initially, the patient was on the regular postpartum floor, but the nurses felt that she was too complicated patient to take care of with group nursing, so she was transferred to the AICU for 1 on 1 care.  Her blood pressure initially was treated with Procardia, but after I started the magnesium sulfate, I discontinued  the Procardia and placed her on labetalol.  She also had a fever the morning after the C-section at 101.3.  She had had 2 prior elevations of 100 and 100.1.  She was presumptively treated for chorioamnionitis and endometritis.  She never became febrile again after 2 days of the clindamycin and gentamicin that was discontinued.  She passed flatus, tolerated a regular diet, ambulated well without difficulty.  Her headache is now resolved and she is ready for discharge.  Her staples have been removed and Steri-Strips applied.  On admission, she did have PIH panel done, which showed an AST and ALT of 21 and 10 respectively. Her BUN was 10, creatinine was 0.66, and platelet count was 221,000. LDH was 222.  The initial hemoglobin was 11.1, hematocrit 34.6, white count 11900.  Later on the day of delivery, the patient had a repeat PIH panel, AST was 23, ALT 9, BUN was 9, creatinine 0.7, platelet count 170,000, LDH was not done.  Uric acid on both tests were 5.3 and 4.8 respectively.  On the morning after delivery, her hemoglobin was 9.6, hematocrit 30.2, white count 12200, and platelet count was 146,000.  FINAL DIAGNOSES: 1. Intrauterine pregnancy at 39 weeks with active labor delivered by     repeat  C-section after 2 prior C-sections and declined vaginal     birth after cesarean. 2. Preeclampsia.  OPERATION:  Low-transverse cervical C-section.  A thyroid panel was done, it was 0.04 TSH, T3 was 159.9, T4 14.9.  This will need to be repeated in the office.  Her capillary blood glucoses were done 106, 81, 78, and 85.  A drug screen was done, there was negative for amphetamines, barbiturates, benzodiazepines, cocaine and tetrahydrocannabinols.  It was positive for opiates.  The patient was being given IV analgesia.  Discharge instructions include a regular booklet.  She is given a prescription for procardia 60 mg XL QD, Percocet 5/325 30 tablets 1 every 4-6 hours as needed for pain and Motrin  600 mg 30 tablets 1 every 6 hours as needed for pain.  She is advised to take her blood pressure 2 or 3 times a day and report blood pressures greater than 150/100.     Malachi Pro. Ambrose Mantle, M.D.     TFH/MEDQ  D:  04/06/2011  T:  04/06/2011  Job:  [id number]

## 2011-04-07 NOTE — Discharge Summary (Signed)
Kathy Reed, Kathy Reed            ACCOUNT NO.:  [id number]  MEDICAL RECORD NO.:  [id number]  LOCATION:  9373                          [facility]ILITY:  [facility]  PHYSICIAN:  Malachi Pro. Ambrose Mantle, M.D. DATE OF BIRTH:  05/16/1986  DATE OF ADMISSION:  04/02/2011 DATE OF DISCHARGE:  04/06/2011                              DISCHARGE SUMMARY   ADDENDUM:  In the discharge summary, I stated that the patient would be discharged on labetalol 200 mg twice daily; however, when the blood pressure was taken on the morning of discharge, it was 160/100, and it was felt best to discontinue her labetalol and discharge her on Procardia 60 mg XL once daily.     Malachi Pro. Ambrose Mantle, M.D.     TFH/MEDQ  D:  04/06/2011  T:  04/06/2011  Job:  [id number]

## 2011-08-23 ENCOUNTER — Encounter (HOSPITAL_COMMUNITY): Payer: Self-pay | Admitting: Emergency Medicine

## 2011-08-23 ENCOUNTER — Emergency Department (HOSPITAL_COMMUNITY)
Admission: EM | Admit: 2011-08-23 | Discharge: 2011-08-23 | Disposition: A | Discharge status: Home / Self Care | Payer: Self-pay | Attending: Emergency Medicine | Admitting: Emergency Medicine

## 2011-08-23 ENCOUNTER — Emergency Department (HOSPITAL_COMMUNITY): Payer: Self-pay

## 2011-08-23 DIAGNOSIS — F17200 Nicotine dependence, unspecified, uncomplicated: Secondary | ICD-10-CM | POA: Insufficient documentation

## 2011-08-23 DIAGNOSIS — M25579 Pain in unspecified ankle and joints of unspecified foot: Secondary | ICD-10-CM | POA: Insufficient documentation

## 2011-08-23 MED ORDER — NAPROXEN 500 MG PO TABS: 500.0000 mg | ORAL_TABLET | Freq: Two times a day (BID) | ORAL | Status: DC

## 2011-08-23 MED ORDER — TRAMADOL HCL 50 MG PO TABS: 50.0000 mg | ORAL_TABLET | Freq: Four times a day (QID) | ORAL | Status: AC | PRN

## 2011-08-23 MED ORDER — TRAMADOL HCL 50 MG PO TABS
50.0000 mg | ORAL_TABLET | Freq: Once | ORAL | Status: AC
  Administered 2011-08-23: 50 mg via ORAL
  Filled 2011-08-23: qty 1
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
## 2011-08-23 NOTE — Discharge Instructions (Signed)
Please read and follow all provided instructions.  Your diagnoses today include:  1. Ankle pain     Tests performed today include:  An x-ray of your ankle - does NOT show any broken bones  Vital signs. See below for your results today.   Medications prescribed:   Ultram (tramadol) - narcotic-like pain medication  If you have been prescribed narcotic pain medication such as Vicodin, Percocet or Tramadol: DO NOT drive or perform any activities that require you to be awake and alert because this medicine can make you drowsy. BE VERY CAREFUL not to take multiple medicines containing Tylenol (also called acetaminophen). Doing so can lead to an overdose which can damage your liver and cause liver failure and possibly death.    Naproxen - anti-inflammatory pain medication  Do not exceed 500mg  naproxen every 12 hours, take with food  You have been prescribed an anti-inflammatory medication or NSAID. Take with food. Take smallest effective dose for the shortest duration needed for your pain. Stop taking if you experience stomach pain or vomiting.   Take any prescribed medications only as directed.  Home care instructions:   Follow any educational materials contained in this packet  Follow R.I.C.E. Protocol:  R - rest your injury   I  - use ice on injury without applying directly to skin  C - compress injury with bandage or splint  E - elevate the injury as much as possible  Follow-up instructions: Please follow-up with your primary care provider or the provided orthopedic (bone specialist) if you continue to have significant pain or trouble walking in 1 week. In this case you may have a severe sprain that requires further care.   If you do not have a primary care doctor -- see below for referral information.   Return instructions:   Please return to the Emergency Department if you experience worsening symptoms.   Please return if you have any other emergent  concerns.  Additional Information:  Your vital signs today were: BP 113/76  Pulse 67  Temp 98.2 F (36.8 C)  Resp 16  SpO2 100%  LMP 08/03/2011 If your blood pressure (BP) was elevated above 135/85 this visit, please have this repeated by your doctor within one month. -------------- Your caregiver has diagnosed you as suffering from an ankle sprain. Ankle sprain occurs when the ligaments that hold the ankle joint together are stretched or torn. It may take 4 to 6 weeks to heal.  For Activity: If prescribed crutches, use crutches with non-weight bearing for the first few days. Then, you may walk on your ankle as the pain allows, or as instructed. Start gradually with weight bearing on the affected ankle. Once you can walk pain free, then try jogging. When you can run forwards, then you can try moving side-to-side. If you cannot walk without crutches in one week, you need a re-check. -------------- No Primary Care Doctor Call [facility]  [phone] Other agencies that provide inexpensive medical care    [facility]  [phone]    [facility]  [phone]    [facility]  [phone]    [facility]  [phone]    [facility]  [phone]    [facility]  [phone] -------------- RESOURCE GUIDE:  Dental Problems  Patients with Medicaid: [facility] [phone] [address]  [address] Phone:  [phone]                                                   Phone:  [phone]  If unable to pay or uninsured, contact:  [facility] or [facility] to become qualified for the adult dental clinic.  Chronic Pain Problems Contact [facility]  [phone] Patients need to be referred by their primary care doctor.  Insufficient Money for Medicine Contact [employer]:  call "211" or [facility]  [phone].  Psychological Services [facility] Behavioral [facility]  [phone] [facility]  [phone] [facility] Mental [facility]   [phone] (emergency services [phone])  Substance Abuse Resources [facility]  [phone] [facility] [phone] [facility] [place] [phone] Floydene Flock [phone] Residential & Outpatient Substance Abuse Program  2047696462  Abuse/Neglect [facility] Child Abuse Hotline [phone] [facility] Child Abuse Hotline [phone] (After Hours)  Emergency Shelter [facility] [phone]  Maternity Homes [facility] [place] [facility] [phone] [place] [facility] [phone]  [facility] Resources  [facility]     [employer]                          [facility] Dept. [address] [place]                       [address]      [address]  Blondell Reveal Phone:  [phone]                                   Phone:  [phone]                 Phone:  [phone]  [facility] Mental Health Phone:  [phone]  [facility] Child Abuse Hotline [phone] (After Hours)

## 2011-08-23 NOTE — ED Notes (Signed)
Pt discharged home. Had no further questions. 

## 2011-08-26 NOTE — ED Provider Notes (Signed)
Medical screening examination/treatment/procedure(s) were performed by non-physician practitioner and as supervising physician I was immediately available for consultation/collaboration.    Nelia Shi, MD 08/26/11 1000

## 2012-03-11 ENCOUNTER — Emergency Department (HOSPITAL_COMMUNITY)
Admission: EM | Admit: 2012-03-11 | Discharge: 2012-03-11 | Disposition: A | Discharge status: Home / Self Care | Payer: Medicaid Other | Attending: Emergency Medicine | Admitting: Emergency Medicine

## 2012-03-11 ENCOUNTER — Encounter (HOSPITAL_COMMUNITY): Payer: Self-pay

## 2012-03-11 DIAGNOSIS — S61409A Unspecified open wound of unspecified hand, initial encounter: Secondary | ICD-10-CM | POA: Insufficient documentation

## 2012-03-11 DIAGNOSIS — W268XXA Contact with other sharp object(s), not elsewhere classified, initial encounter: Secondary | ICD-10-CM | POA: Insufficient documentation

## 2012-03-11 DIAGNOSIS — Y93G1 Activity, food preparation and clean up: Secondary | ICD-10-CM | POA: Insufficient documentation

## 2012-03-11 DIAGNOSIS — S61419A Laceration without foreign body of unspecified hand, initial encounter: Secondary | ICD-10-CM

## 2012-03-11 DIAGNOSIS — F17200 Nicotine dependence, unspecified, uncomplicated: Secondary | ICD-10-CM | POA: Insufficient documentation

## 2012-03-11 NOTE — ED Provider Notes (Signed)
History     CSN: [id number]  Arrival date & time 03/11/12  1349   First MD Initiated Contact with Patient 03/11/12 1421      Chief Complaint  Patient presents with  . Laceration    (Consider location/radiation/quality/duration/timing/severity/associated sxs/prior treatment) HPI Comments: Patient presents with a laceration of her right hand that occurred last night when she was washing dishes. She reports a glass shattering in her hand and cutting her right hand. She reports associated bleeding which is currently controlled. She reports keeping pressure on the laceration to stop the bleeding. She reports washing the wound with soap and water. She did not bandage the wound. She denies pain, numbness/tingling, and coolness of right hand and right index finger. She denies limited ROM of right index finger and hand. She denies any other injury.   Patient is a 26 y.o. female presenting with skin laceration.  Laceration     Past Medical History  Diagnosis Date  . Dysplasia of cervix, low grade (CIN 1)   . Pre-eclampsia     Past Surgical History  Procedure Date  . C section x2   . Knee surgery   . Cesarean section   . Knee surgery   . Incontinence surgery   . Tonsillectomy   . Cesarean section 04/02/2011    Procedure: CESAREAN SECTION;  Surgeon: Bing Plume, MD;  Location: [facility];  Service: Gynecology;  Laterality: N/A;  Repeat [facility] baby boy at [facility] 9/9    No family history on file.  History  Substance Use Topics  . Smoking status: Current Some Day Smoker  . Smokeless tobacco: Not on file  . Alcohol Use: No    OB History    Grav Para Term Preterm Abortions TAB SAB Ect Mult Living   5 2 2  2  2   3       Review of Systems  Skin: Positive for wound.  All other systems reviewed and are negative.    Allergies  Review of patient's allergies indicates no known allergies.  Home Medications   Current Outpatient Rx  Name Route Sig Dispense Refill  . ADULT  MULTIVITAMIN W/MINERALS CH Oral Take 1 tablet by mouth daily.      BP 117/75  Pulse 90  Temp 99 F (37.2 C) (Oral)  Resp 18  SpO2 97%  Physical Exam  Nursing note and vitals reviewed. Constitutional: She is oriented to person, place, and time. She appears well-developed and well-nourished. No distress.  HENT:  Head: Normocephalic and atraumatic.  Eyes: Conjunctivae normal and EOM are normal. No scleral icterus.  Neck: Normal range of motion. Neck supple.  Cardiovascular: Normal rate and regular rhythm.  Exam reveals no gallop and no friction rub.   No murmur heard. Pulmonary/Chest: Effort normal and breath sounds normal. She has no wheezes. She has no rales. She exhibits no tenderness.  Abdominal: Soft. She exhibits no distension.  Musculoskeletal: Normal range of motion.  Neurological: She is alert and oriented to person, place, and time. Coordination normal.       Speech is goal-oriented. Moves limbs without ataxia.   Skin: Skin is warm and dry. She is not diaphoretic.       1.5 cm curved laceration located on right hand just laterally of of index finger MCP.   Psychiatric: She has a normal mood and affect. Her behavior is normal.    ED Course  Procedures (including critical care time)  Labs Reviewed - No data to display No  results found.   1. Laceration of hand       MDM  3:02 PM Patient's laceration will be cleans and dressing will be applied. Patient instructed to keep the laceration covered for one week and wash with soap and water 3x per day. Patient can be discharge without further evaluation. Dr. Estell Harpin saw the patient and is agreeable to plan. Patient instructed to return with any signs of infection.         Emilia Beck, PA-C 03/11/12 2059

## 2012-03-11 NOTE — ED Notes (Signed)
Pt called x1. No answer. Unable to locate. Will try again.

## 2012-03-11 NOTE — ED Notes (Signed)
Pt complains of laceration index finger on right hand, sts occurred last night, bleeding controlled

## 2012-03-13 NOTE — ED Provider Notes (Signed)
Medical screening examination/treatment/procedure(s) were performed by non-physician practitioner and as supervising physician I was immediately available for consultation/collaboration.   Benny Lennert, MD 03/13/12 [phone]

## 2012-03-26 ENCOUNTER — Emergency Department (HOSPITAL_COMMUNITY)
Admission: EM | Admit: 2012-03-26 | Discharge: 2012-03-26 | Disposition: A | Discharge status: Home / Self Care | Payer: Medicaid Other | Attending: Emergency Medicine | Admitting: Emergency Medicine

## 2012-03-26 ENCOUNTER — Encounter (HOSPITAL_COMMUNITY): Payer: Self-pay

## 2012-03-26 DIAGNOSIS — M545 Low back pain, unspecified: Secondary | ICD-10-CM | POA: Insufficient documentation

## 2012-03-26 DIAGNOSIS — F17200 Nicotine dependence, unspecified, uncomplicated: Secondary | ICD-10-CM | POA: Insufficient documentation

## 2012-03-26 DIAGNOSIS — G8929 Other chronic pain: Secondary | ICD-10-CM | POA: Insufficient documentation

## 2012-03-26 DIAGNOSIS — Z8679 Personal history of other diseases of the circulatory system: Secondary | ICD-10-CM | POA: Insufficient documentation

## 2012-03-26 DIAGNOSIS — Z8739 Personal history of other diseases of the musculoskeletal system and connective tissue: Secondary | ICD-10-CM | POA: Insufficient documentation

## 2012-03-26 DIAGNOSIS — Z87410 Personal history of cervical dysplasia: Secondary | ICD-10-CM | POA: Insufficient documentation

## 2012-03-26 DIAGNOSIS — M549 Dorsalgia, unspecified: Secondary | ICD-10-CM

## 2012-03-26 MED ORDER — METHOCARBAMOL 750 MG PO TABS: 750.0000 mg | ORAL_TABLET | Freq: Four times a day (QID) | ORAL | Status: DC

## 2012-03-26 MED ORDER — PREDNISONE 10 MG PO TABS: 20.0000 mg | ORAL_TABLET | Freq: Every day | ORAL | Status: DC

## 2012-03-26 MED ORDER — PREDNISONE 20 MG PO TABS
60.0000 mg | ORAL_TABLET | Freq: Once | ORAL | Status: AC
  Administered 2012-03-26: 60 mg via ORAL
  Filled 2012-03-26 (×2): qty 3

## 2012-03-26 NOTE — ED Notes (Signed)
Pt c/o lower back and neck pain. Pt is carrying a baby in a carrier.

## 2012-03-26 NOTE — ED Notes (Signed)
Pt here with back pain, sts onset several years, and now with pain in neck and radiaitng to shoulder blades, hx of scoliosis and arthirits.

## 2012-03-26 NOTE — ED Provider Notes (Signed)
History   This chart was scribed for Kathy Baker, MD by Charolett Bumpers . The patient was seen in room TR06C/TR06C. Patient's care was started at 1219.   CSN: [id number] Arrival date & time 03/26/12  1204  First MD Initiated Contact with Patient 03/26/12 1219      Chief Complaint  Patient presents with  . Back Pain    The history is provided by the patient. No language interpreter was used.  GENEVEVE Reed is a 26 y.o. female who presents to the Emergency Department complaining of chronic lower back pain. She states she has had the back pain for the past 2 years that worsened in April of this year. She denies any new injuries. She reports aggravation of pain with turning her neck. She denies any radiation of pain. She states she saw her PCP and referred to scoliosis specialist and pain management. She states she has a h/o scoliosis and arthritis in her back.   PCP: Dr. Everlene Other  Past Medical History  Diagnosis Date  . Dysplasia of cervix, low grade (CIN 1)   . Pre-eclampsia     Past Surgical History  Procedure Date  . C section x2   . Knee surgery   . Cesarean section   . Knee surgery   . Incontinence surgery   . Tonsillectomy   . Cesarean section 04/02/2011    Procedure: CESAREAN SECTION;  Surgeon: Bing Plume, MD;  Location: [facility];  Service: Gynecology;  Laterality: N/A;  Re[facility] baby boy at [facility] 9/9    No family history on file.  History  Substance Use Topics  . Smoking status: Current Some Day Smoker  . Smokeless tobacco: Not on file  . Alcohol Use: No    OB History    Grav Para Term Preterm Abortions TAB SAB Ect Mult Living   5 2 2  2  2   3       Review of Systems  Constitutional: Negative for fever and chills.  Respiratory: Negative for shortness of breath.   Gastrointestinal: Negative for nausea and vomiting.  Musculoskeletal: Positive for back pain.  Neurological: Negative for weakness.  All other systems reviewed and are  negative.    Allergies  Review of patient's allergies indicates no known allergies.  Home Medications   Current Outpatient Rx  Name Route Sig Dispense Refill  . ADULT MULTIVITAMIN W/MINERALS CH Oral Take 1 tablet by mouth daily.      BP 116/85  Pulse 101  Temp 98.1 F (36.7 C) (Oral)  Resp 16  SpO2 99%  LMP 03/09/2012  Breastfeeding? No  Physical Exam  Nursing note and vitals reviewed. Constitutional: She is oriented to person, place, and time. She appears well-developed and well-nourished.  Non-toxic appearance.  HENT:  Head: Normocephalic and atraumatic.  Eyes: Conjunctivae normal are normal. Pupils are equal, round, and reactive to light.  Neck: Normal range of motion.  Cardiovascular: Normal rate.   Pulmonary/Chest: Effort normal.  Musculoskeletal: She exhibits tenderness.       Tenderness to palpation to mid lumbar and sacral spine.   Neurological: She is alert and oriented to person, place, and time.  Skin: Skin is warm and dry.  Psychiatric: She has a normal mood and affect.    ED Course  Procedures (including critical care time)  DIAGNOSTIC STUDIES: Oxygen Saturation is 99% on room air, normal by my interpretation.    COORDINATION OF CARE:  12:28-Discussed planned course of treatment with the  patient including treatment with a muscle relaxer and prednisone, who is agreeable at this time.     Labs Reviewed - No data to display No results found.   No diagnosis found.    MDM  Patient given prednisone by mouth here and will be treated for her chronic back pain with the course of prednisone and muscle relaxants   I personally performed the services described in this documentation, which was scribed in my presence. The recorded information has been reviewed and considered.      Kathy Baker, MD 03/26/12 [phone]

## 2013-09-02 ENCOUNTER — Other Ambulatory Visit: Payer: Self-pay | Admitting: Physical Medicine and Rehabilitation

## 2013-09-02 ENCOUNTER — Ambulatory Visit
Admission: RE | Admit: 2013-09-02 | Discharge: 2013-09-02 | Disposition: A | Discharge status: Home / Self Care | Payer: Medicaid Other | Source: Ambulatory Visit | Attending: Physical Medicine and Rehabilitation | Admitting: Physical Medicine and Rehabilitation

## 2013-09-02 DIAGNOSIS — M545 Low back pain, unspecified: Secondary | ICD-10-CM

## 2013-09-25 ENCOUNTER — Emergency Department (HOSPITAL_COMMUNITY)
Admission: EM | Admit: 2013-09-25 | Discharge: 2013-09-25 | Disposition: A | Discharge status: Home / Self Care | Payer: Medicaid Other | Attending: Emergency Medicine | Admitting: Emergency Medicine

## 2013-09-25 ENCOUNTER — Encounter (HOSPITAL_COMMUNITY): Payer: Self-pay | Admitting: Emergency Medicine

## 2013-09-25 DIAGNOSIS — R590 Localized enlarged lymph nodes: Secondary | ICD-10-CM

## 2013-09-25 DIAGNOSIS — R509 Fever, unspecified: Secondary | ICD-10-CM

## 2013-09-25 DIAGNOSIS — Z8742 Personal history of other diseases of the female genital tract: Secondary | ICD-10-CM | POA: Insufficient documentation

## 2013-09-25 DIAGNOSIS — F17200 Nicotine dependence, unspecified, uncomplicated: Secondary | ICD-10-CM | POA: Insufficient documentation

## 2013-09-25 DIAGNOSIS — IMO0002 Reserved for concepts with insufficient information to code with codable children: Secondary | ICD-10-CM | POA: Insufficient documentation

## 2013-09-25 DIAGNOSIS — Z791 Long term (current) use of non-steroidal anti-inflammatories (NSAID): Secondary | ICD-10-CM | POA: Insufficient documentation

## 2013-09-25 DIAGNOSIS — J029 Acute pharyngitis, unspecified: Secondary | ICD-10-CM

## 2013-09-25 MED ORDER — AMOXICILLIN 500 MG PO CAPS: 500.0000 mg | ORAL_CAPSULE | Freq: Three times a day (TID) | ORAL | Status: DC

## 2013-09-25 NOTE — Discharge Instructions (Signed)
Take the prescribed medication as directed.  Continue tylneol or motrin as needed for fever. Follow-up with your primary care physician if problems occur. Return to the ED for new or worsening symptoms.

## 2013-09-25 NOTE — ED Provider Notes (Signed)
Medical screening examination/treatment/procedure(s) were performed by non-physician practitioner and as supervising physician I was immediately available for consultation/collaboration.   EKG Interpretation None        Gwyneth SproutWhitney Render Marley, MD 09/25/13 1516

## 2013-09-25 NOTE — ED Provider Notes (Signed)
CSN: [id number]     Arrival date & time 09/25/13  1346 History  This chart was scribed for non-physician practitioner, Lanae CrumblyLisa Sander, PA-C,working with Gwyneth SproutWhitney Plunkett, MD, by Karle PlumberJennifer Tensley, ED Scribe.  This patient was seen in room WTR5/WTR5 and the patient's care was started at 2:12 PM.  Chief Complaint  Patient presents with  . Neck Pain  . Fever   The history is provided by the patient. No language interpreter was used.   HPI Comments:  Kathy Reed Seevers is a 28 y.o. female who presents to the Emergency Department complaining of right-sided neck pain that started this morning. She states their is a "lump" present and reports associated fever T-max 100.2 degrees. Pt states that her throat has been sore intermittently "for a long time" and reports some discomfort when she swallows. She reports taking Tylenol about three hours ago with some relief with the fever. She denies any sick contacts. She denies nausea, vomiting, or diarrhea.  No headaches or neck stiffness.  Past Medical History  Diagnosis Date  . Dysplasia of cervix, low grade (CIN 1)   . Pre-eclampsia    Past Surgical History  Procedure Laterality Date  . C section x2    . Knee surgery    . Cesarean section    . Knee surgery    . Incontinence surgery    . Tonsillectomy    . Cesarean section  04/02/2011    Procedure: CESAREAN SECTION;  Surgeon: Bing Plumehomas F Henley, MD;  Location: [facility];  Service: Gynecology;  Laterality: N/A;  Repeat of baby boy at [facility]0711,[facility] 9/9   No family history on file. History  Substance Use Topics  . Smoking status: Current Some Day Smoker  . Smokeless tobacco: Not on file  . Alcohol Use: No   OB History   Grav Para Term Preterm Abortions TAB SAB Ect Mult Living   5 2 2  2  2   3      Review of Systems  Constitutional: Positive for fever.  Gastrointestinal: Negative for nausea, vomiting and diarrhea.  All other systems reviewed and are negative.   Allergies  Review of patient's allergies  indicates no known allergies.  Home Medications   Prior to Admission medications   Medication Sig Start Date End Date Taking? Authorizing Provider  methocarbamol (ROBAXIN-750) 750 MG tablet Take 1 tablet (750 mg total) by mouth 4 (four) times daily. 03/26/12   Toy BakerAnthony T Allen, MD  Multiple Vitamin (MULTIVITAMIN WITH MINERALS) TABS Take 1 tablet by mouth daily.    Historical Provider, MD  predniSONE (DELTASONE) 10 MG tablet Take 2 tablets (20 mg total) by mouth daily. 03/26/12   Toy BakerAnthony T Allen, MD   Triage Vitals: BP 108/70  Pulse 83  Temp(Src) 99.2 F (37.3 C) (Oral)  Resp 18  SpO2 98%  LMP 09/02/2013 Physical Exam  Nursing note and vitals reviewed. Constitutional: She is oriented to person, place, and time. She appears well-developed and well-nourished. No distress.  HENT:  Head: Normocephalic and atraumatic.  Mouth/Throat: Uvula is midline and mucous membranes are normal. No trismus in the jaw. No dental abscesses or uvula swelling. Posterior oropharyngeal erythema present. No oropharyngeal exudate, posterior oropharyngeal edema or tonsillar abscesses.  Oropharynx mildly erythematous, tonsils 1+ bilaterally without exudate, uvula midline, no peritonsillar abscess, handling secretions appropriately; no difficulty swallowing or speaking  Eyes: Conjunctivae and EOM are normal. Pupils are equal, round, and reactive to light.  Neck: Normal range of motion and full passive range of motion  without pain. Neck supple. No rigidity.  No meningeal signs.  Cardiovascular: Normal rate, regular rhythm and normal heart sounds.   Pulmonary/Chest: Effort normal and breath sounds normal. No respiratory distress. She has no wheezes.  Abdominal: Soft. Bowel sounds are normal.  Musculoskeletal: Normal range of motion.  Lymphadenopathy:    She has cervical adenopathy (right anterior chain).  Neurological: She is alert and oriented to person, place, and time.  Skin: Skin is warm and dry. She is not  diaphoretic.  Psychiatric: She has a normal mood and affect.    ED Course  Procedures (including critical care time) DIAGNOSTIC STUDIES: Oxygen Saturation is 98% on RA, normal by my interpretation.   COORDINATION OF CARE: 2:17 PM- Will prescribe Amoxicillin. Pt verbalizes understanding and agrees to plan.  Medications - No data to display  Labs Review Labs Reviewed - No data to display  Imaging Review No results found.   EKG Interpretation None      MDM   Final diagnoses:  Cervical lymphadenopathy  Sore throat  Fever   Patient with right anterior cervical lymphadenopathy, possibly due to strep throat. She has no nuchal rigidity or other signs of meningitis. Patient is afebrile, non-toxic appearing, NAD, VS stable- ok for discharge.  She will be started on amoxicillin, continue tylenol/motrin as needed for fever. FU with PCP if problems occur. Discussed plan with patient, he/she acknowledged understanding and agreed with plan of care.  Return precautions given for new or worsening symptoms.  I personally performed the services described in this documentation, which was scribed in my presence. The recorded information has been reviewed and is accurate.  Garlon HatchetLisa M Alannis Hsia, PA-C 09/25/13 1433

## 2013-09-25 NOTE — ED Notes (Signed)
Pt states she woke up this morning with a 9/10 painful lump on the right side of her neck. Pt states she also had a temperature of 100.2. Pt took tylenol at 11AM.

## 2014-04-05 ENCOUNTER — Encounter (HOSPITAL_COMMUNITY): Payer: Self-pay | Admitting: Emergency Medicine

## 2014-04-14 ENCOUNTER — Ambulatory Visit: Payer: Medicaid Other | Admitting: Internal Medicine

## 2014-07-10 ENCOUNTER — Emergency Department (HOSPITAL_COMMUNITY)
Admission: EM | Admit: 2014-07-10 | Discharge: 2014-07-10 | Disposition: A | Discharge status: Home / Self Care | Payer: Medicaid Other | Attending: Emergency Medicine | Admitting: Emergency Medicine

## 2014-07-10 ENCOUNTER — Encounter (HOSPITAL_COMMUNITY): Payer: Self-pay | Admitting: *Deleted

## 2014-07-10 DIAGNOSIS — N390 Urinary tract infection, site not specified: Secondary | ICD-10-CM | POA: Insufficient documentation

## 2014-07-10 DIAGNOSIS — Z79899 Other long term (current) drug therapy: Secondary | ICD-10-CM | POA: Insufficient documentation

## 2014-07-10 DIAGNOSIS — Z7952 Long term (current) use of systemic steroids: Secondary | ICD-10-CM | POA: Insufficient documentation

## 2014-07-10 DIAGNOSIS — Z3202 Encounter for pregnancy test, result negative: Secondary | ICD-10-CM | POA: Insufficient documentation

## 2014-07-10 DIAGNOSIS — Z792 Long term (current) use of antibiotics: Secondary | ICD-10-CM | POA: Insufficient documentation

## 2014-07-10 DIAGNOSIS — R300 Dysuria: Secondary | ICD-10-CM

## 2014-07-10 DIAGNOSIS — Z720 Tobacco use: Secondary | ICD-10-CM | POA: Insufficient documentation

## 2014-07-10 DIAGNOSIS — R319 Hematuria, unspecified: Secondary | ICD-10-CM

## 2014-07-10 LAB — URINALYSIS, ROUTINE W REFLEX MICROSCOPIC
Glucose, UA: NEGATIVE mg/dL
KETONES UR: 15 mg/dL — AB
NITRITE: NEGATIVE
PH: 5.5 (ref 5.0–8.0)
Protein, ur: 100 mg/dL — AB
SPECIFIC GRAVITY, URINE: 1.025 (ref 1.005–1.030)
Urobilinogen, UA: 1 mg/dL (ref 0.0–1.0)

## 2014-07-10 LAB — URINE MICROSCOPIC-ADD ON

## 2014-07-10 LAB — PREGNANCY, URINE: PREG TEST UR: NEGATIVE

## 2014-07-10 MED ORDER — PHENAZOPYRIDINE HCL 200 MG PO TABS: 200.0000 mg | ORAL_TABLET | Freq: Three times a day (TID) | ORAL | Status: DC

## 2014-07-10 MED ORDER — CEPHALEXIN 500 MG PO CAPS: 500.0000 mg | ORAL_CAPSULE | Freq: Four times a day (QID) | ORAL | Status: DC

## 2014-07-10 NOTE — ED Notes (Addendum)
C/o dysuria, chills, abd pain, subjective fever, urgency, "urine is dark and foul smelling", (denies: nausea, diarrhea, constipation, sob, dizziness, back pain, vaginal sx, frequency or other sx), last acetaminophen 2d ago, LNP 1/15, rates pain 8/10, last BM 2d ago. Last ate (now). Sx onset 1 week ago. See hx.

## 2014-07-10 NOTE — ED Provider Notes (Signed)
CSN: [id number]     Arrival date & time 07/10/14  5784 History   First MD Initiated Contact with Patient 07/10/14 [phone]     Chief Complaint  Patient presents with  . Dysuria     (Consider location/radiation/quality/duration/timing/severity/associated sxs/prior Treatment) The history is provided by the patient and medical records.   This is a 29 y.o. Female with no past medical history presents with a 10 day history of lower abdominal pain. Pain ranges from 8-10/10, localized in the lower abdominal quadrants, associated with subjective fever, urinary frequency, dysuria, made worse with movement, and described as stabbing/ sharp. No analgesics attempted. Denies headache, congestion, rhinorrhea, pharyngitis, cough, shortness of breath, chest pain, nausea, vomiting, diarrhea, constipation, or vaginal discharge. Patients last bowel movement was "several" days ago and was normal. Denies cigarette, alcohol, or other drug use. Vital signs stable.   Past Medical History  Diagnosis Date  . Dysplasia of cervix, low grade (CIN 1)   . Pre-eclampsia    Past Surgical History  Procedure Laterality Date  . C section x2    . Knee surgery    . Cesarean section    . Knee surgery    . Incontinence surgery    . Tonsillectomy    . Cesarean section  04/02/2011    Procedure: CESAREAN SECTION;  Surgeon: Bing Plume, MD;  Location: [facility];  Service: Gynecology;  Laterality: N/A;  Repeat of baby boy at [facility] 9/9   History reviewed. No pertinent family history. History  Substance Use Topics  . Smoking status: Current Some Day Smoker  . Smokeless tobacco: Not on file  . Alcohol Use: No   OB History    Gravida Para Term Preterm AB TAB SAB Ectopic Multiple Living   Review of Systems  Constitutional: Positive for fever (subjective) and chills.  Gastrointestinal: Positive for abdominal pain. Negative for nausea, vomiting, diarrhea and constipation.  Genitourinary: Positive for  dysuria and frequency. Negative for vaginal discharge.  All other systems reviewed and are negative.     Allergies  Review of patient's allergies indicates no known allergies.  Home Medications   Prior to Admission medications   Medication Sig Start Date End Date Taking? Authorizing Provider  amoxicillin (AMOXIL) 500 MG capsule Take 1 capsule (500 mg total) by mouth 3 (three) times daily. 09/25/13   Garlon Hatchet, PA-C  methocarbamol (ROBAXIN-750) 750 MG tablet Take 1 tablet (750 mg total) by mouth 4 (four) times daily. 03/26/12   Toy Baker, MD  Multiple Vitamin (MULTIVITAMIN WITH MINERALS) TABS Take 1 tablet by mouth daily.    Historical Provider, MD  predniSONE (DELTASONE) 10 MG tablet Take 2 tablets (20 mg total) by mouth daily. 03/26/12   Toy Baker, MD   BP 135/80 mmHg  Pulse 102  Temp(Src) 99.9 F (37.7 C) (Oral)  Resp 20  Ht  (1.575 m)  Wt 115 lb (52.164 kg)  BMI 21.03 kg/m2  SpO2 100%  LMP 06/18/2014   Physical Exam  Constitutional: She is oriented to person, place, and time. She appears well-developed and well-nourished.  HENT:  Head: Normocephalic and atraumatic.  Mouth/Throat: Oropharynx is clear and moist.  Eyes: Conjunctivae and EOM are normal. Pupils are equal, round, and reactive to light.  Neck: Normal range of motion.  Cardiovascular: Normal rate, regular rhythm and normal heart sounds.   Pulmonary/Chest: Effort normal and breath sounds normal. No respiratory distress.  Abdominal: Soft. Bowel sounds are normal. She exhibits no distension and no mass. There is no tenderness. There is no rebound, no guarding and no CVA tenderness.  Abdomen soft, non-distended, endorses pain in bilateral lower quadrant but no focal tenderness on exam; no peritoneal signs  Musculoskeletal: Normal range of motion.  Neurological: She is alert and oriented to person, place, and time.  Skin: Skin is warm and dry.  Psychiatric: She has a normal mood and affect.   Nursing note and vitals reviewed.   ED Course  Procedures (including critical care time) Labs Review Labs Reviewed  URINALYSIS, ROUTINE W REFLEX MICROSCOPIC - Abnormal; Notable for the following:    Color, Urine AMBER (*)    APPearance TURBID (*)    Hgb urine dipstick LARGE (*)    Bilirubin Urine SMALL (*)    Ketones, ur 15 (*)    Protein, ur 100 (*)    Leukocytes, UA LARGE (*)    All other components within normal limits  URINE MICROSCOPIC-ADD ON - Abnormal; Notable for the following:    Squamous Epithelial / LPF FEW (*)    Bacteria, UA FEW (*)    Crystals CA OXALATE CRYSTALS (*)    All other components within normal limits  PREGNANCY, URINE    Imaging Review No results found.   EKG Interpretation None      MDM   Final diagnoses:  UTI (lower urinary tract infection)  Dysuria  Hematuria   29 year old female with abdominal pain, dysuria, and urinary frequency. She denies any vaginal symptoms. Patient afebrile and nontoxic on exam. Her abdominal exam is benign. Urine pregnancy negative. UA appears infectious, some blood noted. Patient has no current flank pain or history of kidney stones.  Patient will be started on keflex for UTI.  FU with PCP.  Discussed plan with patient, he/she acknowledged understanding and agreed with plan of care.  Return precautions given for new or worsening symptoms.  Garlon HatchetLisa M Drake Wuertz, PA-C 07/10/14 [id number]  Olivia Mackielga M Otter, MD 07/12/14 [phone]

## 2014-07-10 NOTE — Discharge Instructions (Signed)
Take the prescribed medication as directed.  Pyridium will turn urine bright orange/red. Follow-up with your primary care physician. Return to the ED for new or worsening symptoms.

## 2014-10-15 ENCOUNTER — Encounter (HOSPITAL_COMMUNITY): Payer: Self-pay | Admitting: *Deleted

## 2014-10-15 ENCOUNTER — Emergency Department (HOSPITAL_COMMUNITY)
Admission: EM | Admit: 2014-10-15 | Discharge: 2014-10-15 | Disposition: A | Discharge status: Home / Self Care | Payer: Medicaid Other | Attending: Emergency Medicine | Admitting: Emergency Medicine

## 2014-10-15 ENCOUNTER — Emergency Department (HOSPITAL_COMMUNITY): Payer: Medicaid Other

## 2014-10-15 DIAGNOSIS — N898 Other specified noninflammatory disorders of vagina: Secondary | ICD-10-CM | POA: Diagnosis not present

## 2014-10-15 DIAGNOSIS — S0083XA Contusion of other part of head, initial encounter: Secondary | ICD-10-CM | POA: Diagnosis not present

## 2014-10-15 DIAGNOSIS — Y9389 Activity, other specified: Secondary | ICD-10-CM | POA: Diagnosis not present

## 2014-10-15 DIAGNOSIS — Y998 Other external cause status: Secondary | ICD-10-CM | POA: Diagnosis not present

## 2014-10-15 DIAGNOSIS — S3992XA Unspecified injury of lower back, initial encounter: Secondary | ICD-10-CM | POA: Diagnosis not present

## 2014-10-15 DIAGNOSIS — S022XXA Fracture of nasal bones, initial encounter for closed fracture: Secondary | ICD-10-CM | POA: Insufficient documentation

## 2014-10-15 DIAGNOSIS — S3991XA Unspecified injury of abdomen, initial encounter: Secondary | ICD-10-CM | POA: Insufficient documentation

## 2014-10-15 DIAGNOSIS — R509 Fever, unspecified: Secondary | ICD-10-CM | POA: Diagnosis present

## 2014-10-15 DIAGNOSIS — N12 Tubulo-interstitial nephritis, not specified as acute or chronic: Secondary | ICD-10-CM | POA: Diagnosis not present

## 2014-10-15 DIAGNOSIS — Y9289 Other specified places as the place of occurrence of the external cause: Secondary | ICD-10-CM | POA: Insufficient documentation

## 2014-10-15 DIAGNOSIS — R630 Anorexia: Secondary | ICD-10-CM | POA: Insufficient documentation

## 2014-10-15 DIAGNOSIS — E876 Hypokalemia: Secondary | ICD-10-CM | POA: Insufficient documentation

## 2014-10-15 DIAGNOSIS — Z720 Tobacco use: Secondary | ICD-10-CM | POA: Insufficient documentation

## 2014-10-15 DIAGNOSIS — R1030 Lower abdominal pain, unspecified: Secondary | ICD-10-CM

## 2014-10-15 LAB — URINALYSIS, ROUTINE W REFLEX MICROSCOPIC
Bilirubin Urine: NEGATIVE
Glucose, UA: NEGATIVE mg/dL
Ketones, ur: NEGATIVE mg/dL
Nitrite: POSITIVE — AB
PROTEIN: 100 mg/dL — AB
SPECIFIC GRAVITY, URINE: 1.02 (ref 1.005–1.030)
Urobilinogen, UA: 1 mg/dL (ref 0.0–1.0)
pH: 6 (ref 5.0–8.0)

## 2014-10-15 LAB — COMPREHENSIVE METABOLIC PANEL
ALT: 40 U/L (ref 14–54)
ANION GAP: 12 (ref 5–15)
AST: 43 U/L — ABNORMAL HIGH (ref 15–41)
Albumin: 3.4 g/dL — ABNORMAL LOW (ref 3.5–5.0)
Alkaline Phosphatase: 99 U/L (ref 38–126)
BUN: 12 mg/dL (ref 6–20)
CALCIUM: 8.9 mg/dL (ref 8.9–10.3)
CO2: 23 mmol/L (ref 22–32)
Chloride: 98 mmol/L — ABNORMAL LOW (ref 101–111)
Creatinine, Ser: 0.9 mg/dL (ref 0.44–1.00)
GFR calc Af Amer: >60 mL/min (ref >60)
GFR calc non Af Amer: >60 mL/min (ref >60)
Glucose, Bld: 143 mg/dL — ABNORMAL HIGH (ref 65–99)
Potassium: 3 mmol/L — ABNORMAL LOW (ref 3.5–5.1)
SODIUM: 133 mmol/L — AB (ref 135–145)
Total Bilirubin: 0.6 mg/dL (ref 0.3–1.2)
Total Protein: 8.5 g/dL — ABNORMAL HIGH (ref 6.5–8.1)

## 2014-10-15 LAB — CBC WITH DIFFERENTIAL/PLATELET
BASOS PCT: 0 % (ref 0–1)
Basophils Absolute: 0 10*3/uL (ref 0.0–0.1)
EOS ABS: 0 10*3/uL (ref 0.0–0.7)
Eosinophils Relative: 0 % (ref 0–5)
HEMATOCRIT: 34.7 % — AB (ref 36.0–46.0)
Hemoglobin: 11.6 g/dL — ABNORMAL LOW (ref 12.0–15.0)
Lymphocytes Relative: 16 % (ref 12–46)
Lymphs Abs: 1.6 10*3/uL (ref 0.7–4.0)
MCH: 28.9 pg (ref 26.0–34.0)
MCHC: 33.4 g/dL (ref 30.0–36.0)
MCV: 86.3 fL (ref 78.0–100.0)
MONOS PCT: 14 % — AB (ref 3–12)
Monocytes Absolute: 1.4 10*3/uL — ABNORMAL HIGH (ref 0.1–1.0)
Neutro Abs: 6.8 10*3/uL (ref 1.7–7.7)
Neutrophils Relative %: 70 % (ref 43–77)
PLATELETS: 235 10*3/uL (ref 150–400)
RBC: 4.02 MIL/uL (ref 3.87–5.11)
RDW: 12.3 % (ref 11.5–15.5)
WBC: 9.8 10*3/uL (ref 4.0–10.5)

## 2014-10-15 LAB — WET PREP, GENITAL
Clue Cells Wet Prep HPF POC: NONE SEEN
Trich, Wet Prep: NONE SEEN
Yeast Wet Prep HPF POC: NONE SEEN

## 2014-10-15 LAB — PREGNANCY, URINE: Preg Test, Ur: NEGATIVE

## 2014-10-15 LAB — URINE MICROSCOPIC-ADD ON

## 2014-10-15 MED ORDER — SODIUM CHLORIDE 0.9 % IV BOLUS (SEPSIS)
1000.0000 mL | Freq: Once | INTRAVENOUS | Status: AC
  Administered 2014-10-15: 1000 mL via INTRAVENOUS

## 2014-10-15 MED ORDER — CEPHALEXIN 500 MG PO CAPS
500.0000 mg | ORAL_CAPSULE | Freq: Four times a day (QID) | ORAL | Status: DC
Start: 2014-10-15 — End: 2015-04-02

## 2014-10-15 MED ORDER — KETOROLAC TROMETHAMINE 30 MG/ML IJ SOLN
30.0000 mg | Freq: Once | INTRAMUSCULAR | Status: AC
  Administered 2014-10-15: 30 mg via INTRAVENOUS
  Filled 2014-10-15: qty 1

## 2014-10-15 MED ORDER — MORPHINE SULFATE 4 MG/ML IJ SOLN
4.0000 mg | Freq: Once | INTRAMUSCULAR | Status: AC
  Administered 2014-10-15: 4 mg via INTRAVENOUS
  Filled 2014-10-15: qty 1

## 2014-10-15 MED ORDER — ACETAMINOPHEN 325 MG PO TABS: 650.0000 mg | ORAL_TABLET | Freq: Four times a day (QID) | ORAL | Status: DC | PRN

## 2014-10-15 MED ORDER — POTASSIUM CHLORIDE CRYS ER 20 MEQ PO TBCR
40.0000 meq | EXTENDED_RELEASE_TABLET | Freq: Once | ORAL | Status: AC
  Administered 2014-10-15: 40 meq via ORAL
  Filled 2014-10-15: qty 2

## 2014-10-15 MED ORDER — PHENAZOPYRIDINE HCL 100 MG PO TABS
200.0000 mg | ORAL_TABLET | Freq: Once | ORAL | Status: AC
  Administered 2014-10-15: 200 mg via ORAL
  Filled 2014-10-15: qty 2

## 2014-10-15 MED ORDER — IBUPROFEN 600 MG PO TABS: 600.0000 mg | ORAL_TABLET | Freq: Four times a day (QID) | ORAL | Status: DC | PRN

## 2014-10-15 MED ORDER — HYDROMORPHONE HCL 1 MG/ML IJ SOLN
1.0000 mg | Freq: Once | INTRAMUSCULAR | Status: AC
  Administered 2014-10-15: 1 mg via INTRAVENOUS
  Filled 2014-10-15: qty 1

## 2014-10-15 MED ORDER — PHENAZOPYRIDINE HCL 200 MG PO TABS: 200.0000 mg | ORAL_TABLET | Freq: Three times a day (TID) | ORAL | Status: DC

## 2014-10-15 MED ORDER — TRAMADOL HCL 50 MG PO TABS: 50.0000 mg | ORAL_TABLET | Freq: Four times a day (QID) | ORAL | Status: DC | PRN

## 2014-10-15 MED ORDER — DEXTROSE 5 % IV SOLN
1.0000 g | Freq: Once | INTRAVENOUS | Status: AC
  Administered 2014-10-15: 1 g via INTRAVENOUS
  Filled 2014-10-15: qty 10
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
## 2014-10-15 NOTE — Discharge Instructions (Signed)
Abdominal Pain, Women °Abdominal (stomach, pelvic, or belly) pain can be caused by many things. It is important to tell your doctor: °· The location of the pain. °· Does it come and go or is it present all the time? °· Are there things that start the pain (eating certain foods, exercise)? °· Are there other symptoms associated with the pain (fever, nausea, vomiting, diarrhea)? °All of this is helpful to know when trying to find the cause of the pain. °CAUSES  °· Stomach: virus or bacteria infection, or ulcer. °· Intestine: appendicitis (inflamed appendix), regional ileitis (Crohn's disease), ulcerative colitis (inflamed colon), irritable bowel syndrome, diverticulitis (inflamed diverticulum of the colon), or cancer of the stomach or intestine. °· Gallbladder disease or stones in the gallbladder. °· Kidney disease, kidney stones, or infection. °· Pancreas infection or cancer. °· Fibromyalgia (pain disorder). °· Diseases of the female organs: °¨ Uterus: fibroid (non-cancerous) tumors or infection. °¨ Fallopian tubes: infection or tubal pregnancy. °¨ Ovary: cysts or tumors. °¨ Pelvic adhesions (scar tissue). °¨ Endometriosis (uterus lining tissue growing in the pelvis and on the pelvic organs). °¨ Pelvic congestion syndrome (female organs filling up with blood just before the menstrual period). °¨ Pain with the menstrual period. °¨ Pain with ovulation (producing an egg). °¨ Pain with an IUD (intrauterine device, birth control) in the uterus. °¨ Cancer of the female organs. °· Functional pain (pain not caused by a disease, may improve without treatment). °· Psychological pain. °· Depression. °DIAGNOSIS  °Your doctor will decide the seriousness of your pain by doing an examination. °· Blood tests. °· X-rays. °· Ultrasound. °· CT scan (computed tomography, special type of X-ray). °· MRI (magnetic resonance imaging). °· Cultures, for infection. °· Barium enema (dye inserted in the large intestine, to better view it with  X-rays). °· Colonoscopy (looking in intestine with a lighted tube). °· Laparoscopy (minor surgery, looking in abdomen with a lighted tube). °· Major abdominal exploratory surgery (looking in abdomen with a large incision). °TREATMENT  °The treatment will depend on the cause of the pain.  °· Many cases can be observed and treated at home. °· Over-the-counter medicines recommended by your caregiver. °· Prescription medicine. °· Antibiotics, for infection. °· Birth control pills, for painful periods or for ovulation pain. °· Hormone treatment, for endometriosis. °· Nerve blocking injections. °· Physical therapy. °· Antidepressants. °· Counseling with a psychologist or psychiatrist. °· Minor or major surgery. °HOME CARE INSTRUCTIONS  °· Do not take laxatives, unless directed by your caregiver. °· Take over-the-counter pain medicine only if ordered by your caregiver. Do not take aspirin because it can cause an upset stomach or bleeding. °· Try a clear liquid diet (broth or water) as ordered by your caregiver. Slowly move to a bland diet, as tolerated, if the pain is related to the stomach or intestine. °· Have a thermometer and take your temperature several times a day, and record it. °· Bed rest and sleep, if it helps the pain. °· Avoid sexual intercourse, if it causes pain. °· Avoid stressful situations. °· Keep your follow-up appointments and tests, as your caregiver orders. °· If the pain does not go away with medicine or surgery, you may try: °¨ Acupuncture. °¨ Relaxation exercises (yoga, meditation). °¨ Group therapy. °¨ Counseling. °SEEK MEDICAL CARE IF:  °· You notice certain foods cause stomach pain. °· Your home care treatment is not helping your pain. °· You need stronger pain medicine. °· You want your IUD removed. °· You feel faint or   lightheaded. °· You develop nausea and vomiting. °· You develop a rash. °· You are having side effects or an allergy to your medicine. °SEEK IMMEDIATE MEDICAL CARE IF:  °· Your  pain does not go away or gets worse. °· You have a fever. °· Your pain is felt only in portions of the abdomen. The right side could possibly be appendicitis. The left lower portion of the abdomen could be colitis or diverticulitis. °· You are passing blood in your stools (bright red or black tarry stools, with or without vomiting). °· You have blood in your urine. °· You develop chills, with or without a fever. °· You pass out. °MAKE SURE YOU:  °· Understand these instructions. °· Will watch your condition. °· Will get help right away if you are not doing well or get worse. °Document Released: 03/18/2007 Document Revised: 10/05/2013 Document Reviewed: 04/07/2009 °[employer]® Patient Information ©2015 [employer]. This information is not intended to replace advice given to you by your health care provider. Make sure you discuss any questions you have with your health care provider. ° °

## 2014-10-15 NOTE — Discharge Planning (Signed)
NCM consult related to medication assistance.  Pt has Medicaid, Rx =$3; will suggest EDP prescribe abx covered by Medicaid.

## 2014-10-15 NOTE — ED Notes (Signed)
Pt presents via POV c/o fever x 2 weeks.  States she thinks its a UTI or bladder infection, but wants to make sure nothing else is wrong.  Pt c/o burning with urination and lower abdominal pain.  Pt a x 4, NAD.

## 2014-10-15 NOTE — ED Notes (Signed)
PT tolerating po.  Just states she cannot taste food.

## 2014-10-17 LAB — URINE CULTURE: Colony Count: >=100000

## 2014-10-18 LAB — GC/CHLAMYDIA PROBE AMP (~~LOC~~) NOT AT ARMC
Chlamydia: NEGATIVE
Neisseria Gonorrhea: NEGATIVE

## 2014-10-19 ENCOUNTER — Telehealth (HOSPITAL_COMMUNITY): Payer: Self-pay

## 2014-10-19 NOTE — ED Notes (Signed)
Post ED Visit - Positive Culture Follow-up  Culture report reviewed by antimicrobial stewardship pharmacist: []  Wes Dulaney, Pharm.D., BCPS []  Celedonio MiyamotoJeremy Frens, Pharm.D., BCPS []  Georgina PillionElizabeth Martin, Pharm.D., BCPS []  [place]h Pham, [place]Pharm.D., BCPS, AAHIVP []  Estella HuskMichelle Turner, Pharm.D., BCPS, AAHIVP []  Elder CyphersLorie Poole, [address]Pharm[address]D., BCPS Tegan Magsam, Pharm D  Positive urine culture Treated with cephalexin, organism sensitive to the same and no further patient follow-up is required at this time.  Ashley JacobsFesterman, Lacinda Curvin C 10/19/2014, 11:52 AM

## 2015-01-01 ENCOUNTER — Other Ambulatory Visit: Payer: Medicaid Other

## 2015-01-01 ENCOUNTER — Emergency Department (HOSPITAL_COMMUNITY)
Admission: EM | Admit: 2015-01-01 | Discharge: 2015-01-02 | Disposition: A | Discharge status: Home / Self Care | Payer: Medicaid Other | Attending: Emergency Medicine | Admitting: Emergency Medicine

## 2015-01-01 DIAGNOSIS — T401X1A Poisoning by heroin, accidental (unintentional), initial encounter: Secondary | ICD-10-CM

## 2015-01-01 DIAGNOSIS — Y9289 Other specified places as the place of occurrence of the external cause: Secondary | ICD-10-CM | POA: Diagnosis not present

## 2015-01-01 DIAGNOSIS — Y998 Other external cause status: Secondary | ICD-10-CM | POA: Insufficient documentation

## 2015-01-01 DIAGNOSIS — X58XXXA Exposure to other specified factors, initial encounter: Secondary | ICD-10-CM | POA: Diagnosis not present

## 2015-01-01 DIAGNOSIS — Y939 Activity, unspecified: Secondary | ICD-10-CM | POA: Diagnosis not present

## 2015-01-01 MED ORDER — SODIUM CHLORIDE 0.9 % IV BOLUS (SEPSIS)
1000.0000 mL | Freq: Once | INTRAVENOUS | Status: DC
Start: 2015-01-01 — End: 2015-01-02

## 2015-01-01 MED ORDER — ACETAMINOPHEN 325 MG PO TABS
650.0000 mg | ORAL_TABLET | Freq: Once | ORAL | Status: AC
  Administered 2015-01-01: 650 mg via ORAL
  Filled 2015-01-01: qty 2

## 2015-01-01 MED ORDER — SODIUM CHLORIDE 0.9 % IV SOLN
INTRAVENOUS | Status: DC | PRN
  Administered 2015-01-01: 1000 mL via INTRAVENOUS

## 2015-01-01 MED ORDER — NALOXONE HCL 1 MG/ML IJ SOLN
INTRAMUSCULAR | Status: AC
  Filled 2015-01-01: qty 2

## 2015-01-01 MED ORDER — NALOXONE HCL 1 MG/ML IJ SOLN
INTRAMUSCULAR | Status: AC | PRN
  Administered 2015-01-01: 2 mg via INTRAMUSCULAR
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
## 2015-01-01 NOTE — Discharge Instructions (Signed)
Narcotic Overdose °A narcotic overdose is the misuse or overuse of a narcotic drug. A narcotic overdose can make you pass out and stop breathing. If you are not treated right away, this can cause permanent brain damage or stop your heart. Medicine may be given to reverse the effects of an overdose. If so, this medicine may bring on withdrawal symptoms. The symptoms may be abdominal cramps, throwing up (vomiting), sweating, chills, and nervousness. °Injecting narcotics can cause more problems than just an overdose. AIDS, hepatitis, and other very serious infections are transmitted by sharing needles and syringes. If you decide to quit using, there are medicines which can help you through the withdrawal period. Trying to quit all at once on your own can be uncomfortable, but not life-threatening. Call your caregiver, Narcotics Anonymous, or any drug and alcohol treatment program for further help.  °Document Released: 06/28/2004 Document Revised: 08/13/2011 Document Reviewed: 04/22/2009 °ExitCare® Patient Information ©2015 [employer]. This information is not intended to replace advice given to you by your health care provider. Make sure you discuss any questions you have with your health care provider. ° ° °Emergency Department Resource Guide °1) Find a Doctor and Pay Out of Pocket °Although you won't have to find out who is covered by your insurance plan, it is a good idea to ask around and get recommendations. You will then need to call the office and see if the doctor you have chosen will accept you as a new patient and what types of options they offer for patients who are self-pay. Some doctors offer discounts or will set up payment plans for their patients who do not have insurance, but you will need to ask so you aren't surprised when you get to your appointment. ° °2) Contact Your Local Health Department °Not all health departments have doctors that can see patients for sick visits, but many do, so it is  worth a call to see if yours does. If you don't know where your local health department is, you can check in your phone book. The CDC also has a tool to help you locate your state's health department, and many state websites also have listings of all of their local health departments. ° °3) Find a Walk-in Clinic °If your illness is not likely to be very severe or complicated, you may want to try a walk in clinic. These are popping up all over the country in pharmacies, drugstores, and shopping centers. They're usually staffed by nurse practitioners or physician assistants that have been trained to treat common illnesses and complaints. They're usually fairly quick and inexpensive. However, if you have serious medical issues or chronic medical problems, these are probably not your best option. ° °No Primary Care Doctor: °- Call [facility] at  832-8000 - they can help you locate a primary care doctor that  accepts your insurance, provides certain services, etc. °- Physician Referral Service- 1-800-533-3463 ° °Chronic Pain Problems: °Organization         Address  Phone   Notes  °[facility]  (336) 297-2271 Patients need to be referred by their primary care doctor.  ° °Medication Assistance: °Organization         Address  Phone   Notes  °[place] [facility] [address] °[place], [place] [address] (336) 641-8030 --Must be a resident of [place] °-- Must have NO insurance coverage whatsoever (no Medicaid/ Medicare, etc.) °-- The pt. MUST have a primary care   doctor that directs their care regularly and follows them in the community °  °[facility]  (866) 331-1348   °[employer]  (888) 892-1162   ° °Agencies that provide inexpensive medical care: °Organization         Address  Phone   Notes  °[facility]  (336) 832-8035   °[facility]    (336) 832-7272   °[facility] [address] °[place],  [place] [address] (336) 832-4777   °[facility] [address], °[place] (336) 271-4999   °[facility]    (336) 373-0678   °[facility]    (336) 272-1050   °[facility] ° [address], [place] Phone:  (336) 832-4444, Fax:  (336) 832-4440 Hours of Operation:  9 am - 6 pm, M-F.  Also accepts Medicaid/Medicare and self-pay.  °[facility] ° [address], [place] Phone: (336) 832-3150, Fax: (336) 832-3151. Hours of Operation:  8:30 am - 5:30 pm, M-F.  Also accepts Medicaid and self-pay.  °[facility] [place] [address], [place] Phone: (336) 878-6027   °[facility] [address], [place], [place] (336)723-1848, Ext. 123 Mondays & Thursdays: 7-9 AM.  First 15 patients are seen on a first come, first serve basis. °  ° °Medicaid-accepting [place] Providers: ° °Organization         Address  Phone   Notes  °[facility] [address], [place] (336) 641-2100 Also accepts self-pay patients.  °[facility] [address], [place] ° (336) 856-9996   °[facility] [address], [place] (336) 288-8857   °[facility] [address] [place] [address], [place] (336) 299-7000   °Veita Bland [address], [place]  ° (336) 373-1557 Only accepts [place] Access Medicaid patients after they have their name applied to their card.  ° °Self-Pay (no insurance) in [place]: ° °Organization         Address  Phone   Notes  °Sickle Cell Patients, [facility] [address], [place] (336) 832-1970   °[facility] [address], [place] (336) 832-4400   °[facility] ° [address], [place] (336) 992-4800   °[facility]/Dr. Osei-Bonsu ° [address] [place] [address], [place] or [address], [place] (336) 841-8500 Phone number for both [place] and [place] locations is the same.  °[facility] [address], [place] (336) 299-0000   °[facility] [address] [place] [address], [place] or [address] (336) 852-7530 °(336) 878-2260   °[facility] [address],  (336) 350-1642, phone; (336) 294-5005, fax Sees patients 1st and 3rd Saturday of every month.  Must not qualify for public or private insurance (i.e. Medicaid, Medicare, [place] Health Choice, Veterans' Benefits) • Household income should be no more than 200% of the poverty level •The clinic cannot treat you if you are pregnant or think you are pregnant • Sexually transmitted diseases are not treated at the clinic.  ° ° °Dental Care: °Organization         Address  Phone  Notes  °[facility] [address],  (336) 641-6152 Accepts   children up to age 21 who are enrolled in Medicaid or [place] Health Choice; pregnant women with a Medicaid card; and children who have applied for Medicaid or [place] Health Choice, but were declined, whose parents can pay a reduced fee at time of service.  °[facility] [place]  [address], [place] (336) 641-7733 Accepts children up to age 21 who are enrolled in Medicaid or [place] Health Choice; pregnant women with a Medicaid card; and children who have applied for Medicaid or [place] Health Choice, but were declined, whose parents can pay a reduced fee at time of service.  °[facility] ° [address], [place] (336) 641-4533 Patients are seen by appointment only. Walk-ins are not accepted. [facility] will see patients 18 years of age and older. °Monday - Tuesday (8am-5pm) °Most Wednesdays (8:30-5pm) °$30 per visit, cash only  °[facility] ° [address], [place] (336) 641-4533  Patients are seen by appointment only. Walk-ins are not accepted. [facility] will see patients 18 years of age and older. °One Wednesday Evening (Monthly: Volunteer Based).  $30 per visit, cash only  °[facility]  (919) 537-3737 for adults; Children under age 4, call Graduate Pediatric Dentistry at (919) [phone]. Children aged 4-14, please call (919) [phone] to request a pediatric application. ° Dental services are provided in all areas of dental care including fillings, crowns and bridges, complete and partial dentures, implants, gum treatment, root canals, and extractions. Preventive care is also provided. Treatment is provided to both adults and children. °Patients are selected via a lottery and there is often a waiting list. °  °[facility] [address], °[place] ° (336) 763-8833 [email or web address] °  °[facility] [address], [place], [place] (336)723-1848, Ext. 123 Second and Fourth Thursday of each month, opens at 6:30 AM; Clinic ends at 9 AM.  Patients are seen on a first-come first-served basis, and a limited number are seen during each clinic.  ° °[facility] ° [address], [place], [place] (336) 723-7904   Eligibility Requirements °You must have lived in [place], [place], or [place] counties for at least the last three months. °  You cannot be eligible for state or federal sponsored healthcare insurance, including Veterans Administration, Medicaid, or Medicare. °  You generally cannot be eligible for healthcare insurance through your employer.  °  How to apply: °Eligibility screenings are held every Tuesday and Wednesday afternoon from 1:00 pm until 4:00 pm. You do not need an appointment for the interview!  °[facility] [address], [place], [place] 336-631-2330   °[facility] [facility]  336-342-8273   °[facility]  336-703-3100   °[facility]   336-570-6415   ° °Behavioral Health Resources in the Community: °Intensive Outpatient Programs °Organization         Address  Phone  Notes  °[facility] [address], [place], [place] 336-878-6098   °[facility] [address], [place], [place] 336-832-9800   °[facility] [address], [place], [place] ° 336-882-2125   °[facility] [address],  °[place], [place] 1-800-853-5163 or 336-641-4981   °Substance Abuse Resources °Organization         Address  Phone  Notes  °[facility]  336-882-2125   °  [facility]  336-784-9470   °[facility]  336-285-9073   °[facility]  336-845-3988   °Residential & Outpatient Substance Abuse Program  1-800-659-3381   °Psychological Services °Organization         Address  Phone  Notes  °[facility]  336- 832-9600   °[facility]  336- 378-7881   °[facility] [address], [place] 1-800-853-5163 or 336-641-4981   ° °Mobile Crisis Teams °Organization         Address  Phone  Notes  °[facility], Mobile Crisis Care Unit  1-877-626-1772   °Assertive °[facility] ° [address] [place], [place] 336-834-9664   °Sharon DeEsch [address] °[place] 336-554-5454   ° °Self-Help/Support Groups °Organization         Address  Phone             Notes  °[facility] - variety of support groups  336- 373-1402 Call for more information  °Narcotics Anonymous (NA), [facility] [address], °[place]  2 meetings at this location  ° °Residential Treatment Programs °Organization         Address  Phone  Notes  °[facility] [address],    °[place]  1-866-801-8205   °[facility] ° [address], [place], [place] 704-293-8524   °[facility] [address], [place] 336-845-3988 Admissions: 8am-3pm M-F   °[facility] [address]    °[place], [place] 336-841-1104   °[facility] [address], [place], [place] 336-379-7146   °[facility] [address]  °[place], [place] 336-285-9073   °[facility] Intensive Outpatient [address], [place], [place] 336-852-3033   °[facility] [address]  °[place], [place] 1-877-615-2722 or 336-784-9470   °[facility] [address] [place], [place] 336-227-7417 Accepts Medicaid  °[facility] [address]  °[place] 1-800-659-3381 Substance Abuse/Addiction Treatment  ° °[place] Behavioral Health Resources °Organization         Address  Phone  Notes  °[facility]  (888) 581-9988   °Julie Brannon, PhD [address] [place], [place]   (336) 349-5553 or (336) 951-0000   °[facility]   [address] °[place], [place] (336) 349-4454   °[facility] [address], [place], [place] (336) 342-8316 Insurance/Medicaid/sponsorship through [facility]  °[facility] [address]                                    [place], [place] (336) 342-8316 Therapy/tele-psych/case  °[facility] [address]  ° [place], [place] (336) 349-2233    °Dr. Arfeen  (336) 349-4544   °[facility] [place]  [employer] [place] [facility] 1) [address], [place] °2) [address], [place] °3)  [address], [place] (336) 349-3220 °(336) 342-7768 ° °(336) 342-8140   °[place] Child Abuse [facility] (336) 342-1394 or (336) 342-3537 (After Hours)    ° ° ° °
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
## 2015-01-01 NOTE — ED Notes (Signed)
All of patient's clothing cut by other care personnel. Included denim shorts and t shirt. 1 shoe noted in possession of patient at time of arrival. No other belongings present.

## 2015-01-01 NOTE — ED Notes (Signed)
Respirations assisted via BVM
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
## 2015-01-02 NOTE — ED Notes (Signed)
Patient left department with single shoe and clothing(cut). This RN and Anette Riedel, EMT searched room and ambulance bay where patient was extracted from car for other shoe. Pair of shoes located in donated clothing stockpile by Anette Riedel, EMT and given to patient.

## 2015-01-06 MED FILL — Medication: Qty: 1 | Status: AC

## 2015-03-15 ENCOUNTER — Other Ambulatory Visit: Payer: Self-pay | Admitting: Family

## 2015-03-15 DIAGNOSIS — R109 Unspecified abdominal pain: Secondary | ICD-10-CM

## 2015-04-02 ENCOUNTER — Emergency Department (HOSPITAL_COMMUNITY)
Admission: EM | Admit: 2015-04-02 | Discharge: 2015-04-02 | Disposition: A | Discharge status: Home / Self Care | Payer: Medicaid Other | Attending: Emergency Medicine | Admitting: Emergency Medicine

## 2015-04-02 ENCOUNTER — Emergency Department (HOSPITAL_COMMUNITY): Payer: Medicaid Other

## 2015-04-02 ENCOUNTER — Encounter (HOSPITAL_COMMUNITY): Payer: Self-pay | Admitting: *Deleted

## 2015-04-02 DIAGNOSIS — R0602 Shortness of breath: Secondary | ICD-10-CM | POA: Insufficient documentation

## 2015-04-02 DIAGNOSIS — R42 Dizziness and giddiness: Secondary | ICD-10-CM | POA: Diagnosis not present

## 2015-04-02 DIAGNOSIS — N390 Urinary tract infection, site not specified: Secondary | ICD-10-CM | POA: Insufficient documentation

## 2015-04-02 DIAGNOSIS — R110 Nausea: Secondary | ICD-10-CM | POA: Diagnosis not present

## 2015-04-02 DIAGNOSIS — M546 Pain in thoracic spine: Secondary | ICD-10-CM | POA: Diagnosis not present

## 2015-04-02 DIAGNOSIS — L03113 Cellulitis of right upper limb: Secondary | ICD-10-CM

## 2015-04-02 DIAGNOSIS — L02413 Cutaneous abscess of right upper limb: Secondary | ICD-10-CM | POA: Diagnosis not present

## 2015-04-02 DIAGNOSIS — Z720 Tobacco use: Secondary | ICD-10-CM | POA: Diagnosis not present

## 2015-04-02 DIAGNOSIS — K0889 Other specified disorders of teeth and supporting structures: Secondary | ICD-10-CM | POA: Diagnosis not present

## 2015-04-02 DIAGNOSIS — N898 Other specified noninflammatory disorders of vagina: Secondary | ICD-10-CM | POA: Diagnosis present

## 2015-04-02 DIAGNOSIS — R109 Unspecified abdominal pain: Secondary | ICD-10-CM | POA: Insufficient documentation

## 2015-04-02 DIAGNOSIS — A64 Unspecified sexually transmitted disease: Secondary | ICD-10-CM | POA: Diagnosis not present

## 2015-04-02 DIAGNOSIS — L0291 Cutaneous abscess, unspecified: Secondary | ICD-10-CM

## 2015-04-02 DIAGNOSIS — Z3202 Encounter for pregnancy test, result negative: Secondary | ICD-10-CM | POA: Insufficient documentation

## 2015-04-02 HISTORY — DX: Unspecified viral hepatitis C without hepatic coma: B19.20

## 2015-04-02 LAB — URINALYSIS, ROUTINE W REFLEX MICROSCOPIC
Glucose, UA: NEGATIVE mg/dL
Ketones, ur: NEGATIVE mg/dL
NITRITE: POSITIVE — AB
Protein, ur: 30 mg/dL — AB
SPECIFIC GRAVITY, URINE: 1.016 (ref 1.005–1.030)
UROBILINOGEN UA: 4 mg/dL — AB (ref 0.0–1.0)
pH: 6 (ref 5.0–8.0)

## 2015-04-02 LAB — CBC WITH DIFFERENTIAL/PLATELET
BASOS ABS: 0 10*3/uL (ref 0.0–0.1)
Basophils Relative: 0 %
Eosinophils Absolute: 0.1 10*3/uL (ref 0.0–0.7)
Eosinophils Relative: 1 %
HEMATOCRIT: 33.5 % — AB (ref 36.0–46.0)
HEMOGLOBIN: 10.8 g/dL — AB (ref 12.0–15.0)
LYMPHS PCT: 18 %
Lymphs Abs: 1.6 10*3/uL (ref 0.7–4.0)
MCH: 28.1 pg (ref 26.0–34.0)
MCHC: 32.2 g/dL (ref 30.0–36.0)
MCV: 87.2 fL (ref 78.0–100.0)
MONO ABS: 0.9 10*3/uL (ref 0.1–1.0)
Monocytes Relative: 10 %
NEUTROS ABS: 6.3 10*3/uL (ref 1.7–7.7)
NEUTROS PCT: 71 %
Platelets: 264 10*3/uL (ref 150–400)
RBC: 3.84 MIL/uL — AB (ref 3.87–5.11)
RDW: 13.6 % (ref 11.5–15.5)
WBC: 8.9 10*3/uL (ref 4.0–10.5)

## 2015-04-02 LAB — COMPREHENSIVE METABOLIC PANEL
ALBUMIN: 3.9 g/dL (ref 3.5–5.0)
ALT: 111 U/L — ABNORMAL HIGH (ref 14–54)
AST: 137 U/L — ABNORMAL HIGH (ref 15–41)
Alkaline Phosphatase: 86 U/L (ref 38–126)
Anion gap: 6 (ref 5–15)
BUN: 12 mg/dL (ref 6–20)
CO2: 28 mmol/L (ref 22–32)
Calcium: 8.9 mg/dL (ref 8.9–10.3)
Chloride: 101 mmol/L (ref 101–111)
Creatinine, Ser: 0.78 mg/dL (ref 0.44–1.00)
GFR calc non Af Amer: >60 mL/min (ref >60)
GLUCOSE: 81 mg/dL (ref 65–99)
POTASSIUM: 3.7 mmol/L (ref 3.5–5.1)
Sodium: 135 mmol/L (ref 135–145)
TOTAL PROTEIN: 9 g/dL — AB (ref 6.5–8.1)
Total Bilirubin: 1.2 mg/dL (ref 0.3–1.2)

## 2015-04-02 LAB — URINE MICROSCOPIC-ADD ON

## 2015-04-02 LAB — POC URINE PREG, ED: PREG TEST UR: NEGATIVE

## 2015-04-02 LAB — I-STAT CG4 LACTIC ACID, ED: LACTIC ACID, VENOUS: 0.55 mmol/L (ref 0.5–2.0)

## 2015-04-02 LAB — WET PREP, GENITAL: YEAST WET PREP: NONE SEEN

## 2015-04-02 MED ORDER — PHENAZOPYRIDINE HCL 200 MG PO TABS: 200.0000 mg | ORAL_TABLET | Freq: Three times a day (TID) | ORAL | Status: DC

## 2015-04-02 MED ORDER — TRAMADOL HCL 50 MG PO TABS: 50.0000 mg | ORAL_TABLET | Freq: Four times a day (QID) | ORAL | Status: DC | PRN

## 2015-04-02 MED ORDER — AZITHROMYCIN 1 G PO PACK
1.0000 g | PACK | Freq: Once | ORAL | Status: AC
  Administered 2015-04-02: 1 g via ORAL
  Filled 2015-04-02: qty 1

## 2015-04-02 MED ORDER — CEPHALEXIN 500 MG PO CAPS
1000.0000 mg | ORAL_CAPSULE | Freq: Once | ORAL | Status: AC
  Administered 2015-04-02: 1000 mg via ORAL
  Filled 2015-04-02: qty 2

## 2015-04-02 MED ORDER — LIDOCAINE-EPINEPHRINE 1 %-1:100000 IJ SOLN
10.0000 mL | Freq: Once | INTRAMUSCULAR | Status: DC
  Filled 2015-04-02: qty 1

## 2015-04-02 MED ORDER — MORPHINE SULFATE (PF) 4 MG/ML IV SOLN
4.0000 mg | Freq: Once | INTRAVENOUS | Status: AC
  Administered 2015-04-02: 4 mg via INTRAMUSCULAR
  Filled 2015-04-02: qty 1

## 2015-04-02 MED ORDER — CEPHALEXIN 500 MG PO CAPS: 500.0000 mg | ORAL_CAPSULE | Freq: Four times a day (QID) | ORAL | Status: DC

## 2015-04-02 MED ORDER — ACETAMINOPHEN 325 MG PO TABS
650.0000 mg | ORAL_TABLET | Freq: Once | ORAL | Status: AC
  Administered 2015-04-02: 650 mg via ORAL
  Filled 2015-04-02: qty 2

## 2015-04-02 MED ORDER — IBUPROFEN 600 MG PO TABS: 600.0000 mg | ORAL_TABLET | Freq: Four times a day (QID) | ORAL | Status: DC | PRN

## 2015-04-02 NOTE — Discharge Instructions (Signed)
°Emergency Department Resource Guide °1) Find a Doctor and Pay Out of Pocket °Although you won't have to find out who is covered by your insurance plan, it is a good idea to ask around and get recommendations. You will then need to call the office and see if the doctor you have chosen will accept you as a new patient and what types of options they offer for patients who are self-pay. Some doctors offer discounts or will set up payment plans for their patients who do not have insurance, but you will need to ask so you aren't surprised when you get to your appointment. ° °2) Contact Your Local Health Department °Not all health departments have doctors that can see patients for sick visits, but many do, so it is worth a call to see if yours does. If you don't know where your local health department is, you can check in your phone book. The CDC also has a tool to help you locate your state's health department, and many state websites also have listings of all of their local health departments. ° °3) Find a Walk-in Clinic °If your illness is not likely to be very severe or complicated, you may want to try a walk in clinic. These are popping up all over the country in pharmacies, drugstores, and shopping centers. They're usually staffed by nurse practitioners or physician assistants that have been trained to treat common illnesses and complaints. They're usually fairly quick and inexpensive. However, if you have serious medical issues or chronic medical problems, these are probably not your best option. ° °No Primary Care Doctor: °- Call [facility] at  832-8000 - they can help you locate a primary care doctor that  accepts your insurance, provides certain services, etc. °- Physician Referral Service- 1-800-533-3463 ° °Chronic Pain Problems: °Organization         Address  Phone   Notes  °[facility]  (336) 297-2271 Patients need to be referred by their primary care doctor.  ° °Medication  Assistance: °Organization         Address  Phone   Notes  °[place] [facility] [address] °[place], [place] [address] (336) 641-8030 --Must be a resident of [place] °-- Must have NO insurance coverage whatsoever (no Medicaid/ Medicare, etc.) °-- The pt. MUST have a primary care doctor that directs their care regularly and follows them in the community °  °[facility]  (866) 331-1348   °[employer]  (888) 892-1162   ° °Agencies that provide inexpensive medical care: °Organization         Address  Phone   Notes  °[facility]  (336) 832-8035   °[facility]    (336) 832-7272   °[facility] [address] °[place], [place] [address] (336) 832-4777   °[facility] [address], °[place] (336) 271-4999   °[facility]    (336) 373-0678   °[facility]    (336) 272-1050   °[facility] ° [address], [place] Phone:  (336) 832-4444, Fax:  (336) 832-4440 Hours of Operation:  9 am - 6 pm, M-F.  Also accepts Medicaid/Medicare and self-pay.  °[facility] ° [address],  Phone: (336) 832-3150, Fax: (336) 832-3151. Hours of Operation:  8:30 am - 5:30 pm, M-F.  Also accepts Medicaid and self-pay.  °[facility] [place] [address], [place] Phone: (336) 878-6027   °[facility] [address], [place], [place] (336)723-1848, Ext. 123 Mondays & Thursdays: 7-9 AM.  First 15 patients are seen on a first come, first serve basis. °  ° °Medicaid-accepting [place] Providers: ° °Organization         Address  Phone   Notes  °[facility] [address], [place] (336) 641-2100 Also accepts self-pay patients.  °[facility] [address], [place] ° (336) 856-9996   °[facility] [address], [place]  (336) 288-8857   °[facility] [address] [place] [address], [place] (336) 299-7000   °Veita Bland [address], [place]  ° (336) 373-1557 Only accepts [employer] Access Medicaid patients after they have their name applied to their card.  ° °Self-Pay (no insurance) in [place]: ° °Organization         Address  Phone   Notes  °Sickle Cell Patients, [facility] [address], [place] (336) 832-1970   °[facility] [address], [place] (336) 832-4400   °[facility] [place] ° [address], [place] (336) 992-4800   °[facility]/Dr. Osei-Bonsu ° [address] [place] [address], [place] or [address], [place] (336) 841-8500 Phone number for both [place] and [place] locations is the same.  °[facility] [address], [place] (336) 299-0000   °[facility] [address] [place] [address], [place] or [address] (336) 852-7530 °(336) 878-2260   °[facility] [address], [place] (336) 350-1642, phone; (336) 294-5005, fax Sees patients 1st and 3rd Saturday of every month.  Must not qualify for public or private insurance (i.e. Medicaid, Medicare, [place] Health Choice, Veterans' Benefits) • Household income should be no more than 200% of the poverty level •The clinic cannot treat you if you are pregnant or think you are pregnant • Sexually transmitted diseases are not treated at the clinic.  ° ° °Dental Care: °Organization         Address  Phone  Notes  °[place] [facility] [address], [place] (336) 641-6152 Accepts children up to age 21 who are enrolled in Medicaid or [place] Health Choice; pregnant women with a Medicaid card; and children who have applied for Medicaid or [place] Health Choice, but were declined, whose parents can pay a reduced fee at time of service.  °[place]  [facility] [place]  [address], [place] (336) 641-7733 Accepts children up to age 21 who are enrolled in Medicaid or [place] Health Choice; pregnant women with a Medicaid card; and children who have applied for Medicaid or  Health Choice, but were declined, whose parents can pay a reduced fee at time of service.  °[facility] ° [address], [place] (336) 641-4533 Patients are seen by appointment only. Walk-ins are not accepted. [facility] will see patients 18 years of age and older. °Monday - Tuesday (8am-5pm) °Most Wednesdays (8:30-5pm) °$30 per visit, cash only  °[facility] ° [address], [place] (336) 641-4533 Patients are seen by appointment only. Walk-ins are not accepted. [facility] will see patients 18 years of age and older. °One   Wednesday Evening (Monthly: Volunteer Based).  $30 per visit, cash only  °[facility]  (919) 537-3737 for adults; Children under age 4, call Graduate Pediatric Dentistry at (919) [phone]. Children aged 4-14, please call (919) [phone] to request a pediatric application. ° Dental services are provided in all areas of dental care including fillings, crowns and bridges, complete and partial dentures, implants, gum treatment, root canals, and extractions. Preventive care is also provided. Treatment is provided to both adults and children. °Patients are selected via a lottery and there is often a waiting list. °  °[facility] [address], °[place] ° (336) 763-8833 [email or web address] °  °[facility] [address], [place], [place] (336)723-1848, Ext. 123 Second and Fourth Thursday of each month, opens at 6:30 AM; Clinic ends at 9 AM.  Patients are seen on a first-come first-served basis, and a limited number are seen during each clinic.  ° °[facility] ° [address], [place], [place] (336) 723-7904    Eligibility Requirements °You must have lived in [place], or [place] counties for at least the last three months. °  You cannot be eligible for state or federal sponsored healthcare insurance, including Veterans Administration, Medicaid, or Medicare. °  You generally cannot be eligible for healthcare insurance through your employer.  °  How to apply: °Eligibility screenings are held every Tuesday and Wednesday afternoon from 1:00 pm until 4:00 pm. You do not need an appointment for the interview!  °[facility] [address], [place], [place] 336-631-2330   °[place] [facility]  336-342-8273   °[facility]  336-703-3100   °[facility]  336-570-6415   ° °Behavioral Health Resources in the Community: °Intensive Outpatient Programs °Organization         Address  Phone  Notes  °[facility] [address], [place], [place] 336-878-6098   °[facility] [address], [place], [place] 336-832-9800   °[facility] [address], [place], [place] ° 336-882-2125   °[facility] [address],  °[place], [place] 1-800-853-5163 or 336-641-4981   °Substance Abuse Resources °Organization         Address  Phone  Notes  °[facility]  336-882-2125   °[facility]  336-784-9470   °[facility]  336-285-9073   °[facility]  336-845-3988   °Residential & Outpatient Substance Abuse Program  1-800-659-3381   °Psychological Services °Organization         Address  Phone  Notes  °[facility]  336- 832-9600   °[facility]  336- 378-7881   °[facility] [address], [place] 1-800-853-5163 or 336-641-4981   ° °Mobile Crisis Teams °Organization         Address  Phone  Notes  °[facility], Mobile Crisis Care Unit  1-877-626-1772   °Assertive °[facility] ° [address]  [place], [place] 336-834-9664   °Sharon DeEsch [address] °[place] 336-554-5454   ° °Self-Help/Support Groups °Organization         Address  Phone             Notes  °[facility] - variety of support groups  336- 373-1402 Call for more information  °Narcotics Anonymous (NA), [facility] [address], °[place]  2 meetings at this location  ° °  Residential Treatment Programs °Organization         Address  Phone  Notes  °[facility] [address],    °[place]  1-866-801-8205   °[facility] ° [address], [place], [place] 704-293-8524   °[facility] [address], [place] 336-845-3988 Admissions: 8am-3pm M-F  °[facility] [address]    °[place], [place] 336-841-1104   °[facility] [address], [place], [place] 336-379-7146   °[facility] [address]  °[place], [place] 336-285-9073   °[facility] Intensive Outpatient [address], [place], [place] 336-852-3033   °[facility] [address]  °[place], [place] 1-877-615-2722 or 336-784-9470   °[facility] [address] [place], [place] 336-227-7417 Accepts Medicaid  °[facility] [address]  ° [place] 1-800-659-3381 Substance Abuse/Addiction Treatment  ° °[place] Behavioral Health Resources °Organization         Address  Phone  Notes  °[facility]  (888) 581-9988   °Julie Brannon, PhD [address] [place], [place]   (336) 349-5553 or (336) 951-0000   °[facility]   [address] °[place], [place] (336) 349-4454   °[facility] [address], [place], [place] (336) 342-8316 Insurance/Medicaid/sponsorship through [facility]  °[facility] [address]                                    [place], [place] (336) 342-8316 Therapy/tele-psych/case    °[facility] [address]  ° [place], [place] (336) 349-2233    °Dr. Arfeen  (336) 349-4544   °[facility] [place]  [employer] [place] [facility] 1) [address], [place] °2) [address], [place] °3)  [address], [place] (336) 349-3220 °(336) 342-7768 ° °(336) 342-8140   °[place] [facility] (336) 342-1394 or (336) 342-3537 (After Hours)    ° ° °
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
## 2015-04-02 NOTE — ED Provider Notes (Signed)
CSN: [id number]     Arrival date & time 04/02/15  1330 History   First MD Initiated Contact with Patient 04/02/15 1455     Chief Complaint  Patient presents with  . Abscess  . Vaginal Discharge  . Neck Pain     (Consider location/radiation/quality/duration/timing/severity/associated sxs/prior Treatment) Patient is a 29 y.o. female presenting with abscess.  Abscess Location:  Shoulder/arm Shoulder/arm abscess location:  R forearm Size:  3 Abscess quality: draining, fluctuance, induration, painful, redness and warmth   Red streaking: no   Duration:  5 days Progression:  Worsening Pain details:    Severity:  Mild   Timing:  Constant   Progression:  Worsening Chronicity:  New Relieved by:  None tried Worsened by:  Nothing tried Ineffective treatments:  None tried Associated symptoms: fatigue and nausea   Associated symptoms: no anorexia, no fever, no headaches and no vomiting     Past Medical History  Diagnosis Date  . Dysplasia of cervix, low grade (CIN 1)   . Pre-eclampsia   . Hepatitis C    Past Surgical History  Procedure Laterality Date  . C section x2    . Knee surgery    . Cesarean section    . Knee surgery    . Incontinence surgery    . Tonsillectomy    . Cesarean section  04/02/2011    Procedure: CESAREAN SECTION;  Surgeon: Bing Plume, MD;  Location: [facility];  Service: Gynecology;  Laterality: N/A;  [facility] baby boy [facility] 9/9   History reviewed. No pertinent family history. Social History  Substance Use Topics  . Smoking status: Current Some Day Smoker  . Smokeless tobacco: None  . Alcohol Use: No   OB History    Gravida Para Term Preterm AB TAB SAB Ectopic Multiple Living   Review of Systems  Constitutional: Positive for activity change, appetite change and fatigue. Negative for fever.  HENT: Negative for congestion and rhinorrhea.   Eyes: Negative for pain, redness and visual disturbance.  Respiratory:  Positive for cough and shortness of breath. Negative for chest tightness.   Cardiovascular: Negative for chest pain and leg swelling.  Gastrointestinal: Positive for nausea and abdominal pain. Negative for vomiting, diarrhea and anorexia.  Endocrine: Negative for polydipsia and polyuria.  Genitourinary: Negative for dysuria and pelvic pain.  Musculoskeletal: Negative for back pain and neck pain.  Neurological: Positive for light-headedness. Negative for dizziness, seizures and headaches.  Psychiatric/Behavioral: Negative for behavioral problems. The patient is not nervous/anxious.       Allergies  Review of patient's allergies indicates no known allergies.  Home Medications   Prior to Admission medications   Medication Sig Start Date End Date Taking? Authorizing Provider  cephALEXin (KEFLEX) 500 MG capsule Take 1 capsule (500 mg total) by mouth 4 (four) times daily. 04/02/15   Marily Memos, MD  ibuprofen (ADVIL,MOTRIN) 600 MG tablet Take 1 tablet (600 mg total) by mouth every 6 (six) hours as needed for moderate pain. 04/02/15   Marily Memos, MD  phenazopyridine (PYRIDIUM) 200 MG tablet Take 1 tablet (200 mg total) by mouth 3 (three) times daily. 04/02/15   Marily Memos, MD  traMADol (ULTRAM) 50 MG tablet Take 1 tablet (50 mg total) by mouth every 6 (six) hours as needed for severe pain. 04/02/15   Barbara Cower Devri Kreher, MD   BP 100/59 mmHg  Pulse 88  Temp(Src) 102 F (38.9  C) (Oral)  Resp 18  Wt 114 lb (51.71 kg)  SpO2 100%  LMP 04/01/2015 (Exact Date) Physical Exam  Constitutional: She appears well-developed and well-nourished.  HENT:  Head: Normocephalic and atraumatic.  Neck: Normal range of motion.  Cardiovascular: Normal rate and regular rhythm.   Pulmonary/Chest: No stridor. No respiratory distress.  Abdominal: She exhibits no distension. There is no tenderness.  Genitourinary: There is bleeding in the vagina. No erythema or tenderness in the vagina. No signs of injury around  the vagina. No vaginal discharge found.  Neurological: She is alert.  Skin: There is erythema (abscess to right forearm).  Psychiatric: She has a normal mood and affect.  Nursing note and vitals reviewed.   ED Course  .Marland Kitchen.Incision and Drainage Date/Time: 04/03/2015 1:41 PM Performed by: Marily MemosMESNER, Arlanda Shiplett Authorized by: Marily MemosMESNER, Lakecia Deschamps Consent: Verbal consent obtained. Risks and benefits: risks, benefits and alternatives were discussed Consent given by: patient Patient understanding: patient states understanding of the procedure being performed Patient consent: the patient's understanding of the procedure matches consent given Patient identity confirmed: verbally with patient Type: abscess Body area: upper extremity Location details: right arm Anesthesia: local infiltration Local anesthetic: lidocaine 1% with epinephrine Anesthetic total: 2 ml Patient sedated: no Scalpel size: 11 Incision type: single straight Incision depth: dermal Complexity: simple Drainage: purulent and  bloody Drainage amount: scant Wound treatment: wound left open Patient tolerance: Patient tolerated the procedure well with no immediate complications   (including critical care time) Labs Review Labs Reviewed  WET PREP, GENITAL - Abnormal; Notable for the following:    Trich, Wet Prep MANY (*)    Clue Cells Wet Prep HPF POC MANY (*)    WBC, Wet Prep HPF POC MANY (*)    All other components within normal limits  COMPREHENSIVE METABOLIC PANEL - Abnormal; Notable for the following:    Total Protein 9.0 (*)    AST 137 (*)    ALT 111 (*)    All other components within normal limits  CBC WITH DIFFERENTIAL/PLATELET - Abnormal; Notable for the following:    RBC 3.84 (*)    Hemoglobin 10.8 (*)    HCT 33.5 (*)    All other components within normal limits  URINALYSIS, ROUTINE W REFLEX MICROSCOPIC (NOT AT [facility]) - Abnormal; Notable for the following:    Color, Urine AMBER (*)    APPearance TURBID (*)    Hgb  urine dipstick LARGE (*)    Bilirubin Urine SMALL (*)    Protein, ur 30 (*)    Urobilinogen, UA 4.0 (*)    Nitrite POSITIVE (*)    Leukocytes, UA LARGE (*)    All other components within normal limits  URINE MICROSCOPIC-ADD ON - Abnormal; Notable for the following:    Bacteria, UA MANY (*)    All other components within normal limits  RPR  HIV ANTIBODY (ROUTINE TESTING)  I-STAT CG4 LACTIC ACID, ED  POC URINE PREG, ED  GC/CHLAMYDIA PROBE AMP () NOT AT [facility]    Imaging Review Dg Chest 2 View  04/02/2015  CLINICAL DATA:  Fever for 1 week.  No chest complaints. EXAM: CHEST  2 VIEW COMPARISON:  Chest x-ray dated 12/13/2009. FINDINGS: The heart size and mediastinal contours are within normal limits. Both lungs are clear. Lung volumes are normal. Scoliotic deformity again noted at the thoracolumbar junction, unchanged. No acute osseous abnormality. IMPRESSION: Stable chest x-ray. No evidence of acute cardiopulmonary abnormality. No pneumonia seen. Electronically Signed   By: Weyman CroonStan  Linde Gillis M.D.   On: 04/02/2015 16:23   I have personally reviewed and evaluated these images and lab results as part of my medical decision-making.   EKG Interpretation None      MDM   Final diagnoses:  Abscess  Cellulitis of right upper extremity  UTI (lower urinary tract infection)  STD (female)  Pain, dental  Bilateral thoracic back pain   Multiple complaints:  Abscess to right arm with surrounding cellulitis - I&D'ed, will start abx.  Vaginal discharge - positive trich, highly likely to have gc/chlam, gave azithromycin and the keflex started for cellulitis should cover for gonorrhea. No e/o PID UTI - Keflex Fever and fatigue - likely 2/2 multiple infections Dental pain - no e/o infection, caries, abscess or broken tooth. Unsure of cause of symptoms. Will fu w/ dentist from resource list.   I have personally and contemperaneously reviewed labs and imaging and used in my decision making as  above.   A medical screening exam was performed and I feel the patient has had an appropriate workup for their chief complaint at this time and likelihood of emergent condition existing is low. They have been counseled on decision, discharge, follow up and which symptoms necessitate immediate return to the emergency department. They or their family verbally stated understanding and agreement with plan and discharged in stable condition.      Marily Memos, MD 04/03/15 [phone]

## 2015-04-03 LAB — HIV ANTIBODY (ROUTINE TESTING W REFLEX): HIV Screen 4th Generation wRfx: NONREACTIVE

## 2015-04-03 LAB — RPR: RPR Ser Ql: NONREACTIVE

## 2015-04-04 LAB — GC/CHLAMYDIA PROBE AMP (~~LOC~~) NOT AT ARMC
Chlamydia: NEGATIVE
Neisseria Gonorrhea: NEGATIVE

## 2015-04-19 ENCOUNTER — Emergency Department (HOSPITAL_COMMUNITY)
Admission: EM | Admit: 2015-04-19 | Discharge: 2015-04-19 | Disposition: A | Discharge status: Home / Self Care | Payer: Medicaid Other | Attending: Emergency Medicine | Admitting: Emergency Medicine

## 2015-04-19 ENCOUNTER — Encounter (HOSPITAL_COMMUNITY): Payer: Self-pay

## 2015-04-19 DIAGNOSIS — A5901 Trichomonal vulvovaginitis: Secondary | ICD-10-CM | POA: Diagnosis not present

## 2015-04-19 DIAGNOSIS — N390 Urinary tract infection, site not specified: Secondary | ICD-10-CM | POA: Insufficient documentation

## 2015-04-19 DIAGNOSIS — N760 Acute vaginitis: Secondary | ICD-10-CM

## 2015-04-19 DIAGNOSIS — N739 Female pelvic inflammatory disease, unspecified: Secondary | ICD-10-CM | POA: Diagnosis not present

## 2015-04-19 DIAGNOSIS — R300 Dysuria: Secondary | ICD-10-CM | POA: Diagnosis present

## 2015-04-19 DIAGNOSIS — F17200 Nicotine dependence, unspecified, uncomplicated: Secondary | ICD-10-CM | POA: Insufficient documentation

## 2015-04-19 DIAGNOSIS — N72 Inflammatory disease of cervix uteri: Secondary | ICD-10-CM

## 2015-04-19 DIAGNOSIS — Z3202 Encounter for pregnancy test, result negative: Secondary | ICD-10-CM | POA: Diagnosis not present

## 2015-04-19 LAB — CBC
HEMATOCRIT: 32.4 % — AB (ref 36.0–46.0)
Hemoglobin: 10.5 g/dL — ABNORMAL LOW (ref 12.0–15.0)
MCH: 28.2 pg (ref 26.0–34.0)
MCHC: 32.4 g/dL (ref 30.0–36.0)
MCV: 87.1 fL (ref 78.0–100.0)
Platelets: 215 10*3/uL (ref 150–400)
RBC: 3.72 MIL/uL — ABNORMAL LOW (ref 3.87–5.11)
RDW: 15.6 % — AB (ref 11.5–15.5)
WBC: 12.1 10*3/uL — AB (ref 4.0–10.5)

## 2015-04-19 LAB — COMPREHENSIVE METABOLIC PANEL
ALT: 134 U/L — AB (ref 14–54)
AST: 86 U/L — AB (ref 15–41)
Albumin: 3.1 g/dL — ABNORMAL LOW (ref 3.5–5.0)
Alkaline Phosphatase: 94 U/L (ref 38–126)
Anion gap: 5 (ref 5–15)
BILIRUBIN TOTAL: 1.1 mg/dL (ref 0.3–1.2)
BUN: 20 mg/dL (ref 6–20)
CHLORIDE: 103 mmol/L (ref 101–111)
CO2: 28 mmol/L (ref 22–32)
Calcium: 8.6 mg/dL — ABNORMAL LOW (ref 8.9–10.3)
Creatinine, Ser: 0.89 mg/dL (ref 0.44–1.00)
GLUCOSE: 115 mg/dL — AB (ref 65–99)
POTASSIUM: 3.9 mmol/L (ref 3.5–5.1)
Sodium: 136 mmol/L (ref 135–145)
Total Protein: 7.6 g/dL (ref 6.5–8.1)

## 2015-04-19 LAB — URINALYSIS, ROUTINE W REFLEX MICROSCOPIC
Bilirubin Urine: NEGATIVE
GLUCOSE, UA: NEGATIVE mg/dL
KETONES UR: NEGATIVE mg/dL
NITRITE: POSITIVE — AB
PROTEIN: NEGATIVE mg/dL
Specific Gravity, Urine: 1.009 (ref 1.005–1.030)
pH: 6 (ref 5.0–8.0)

## 2015-04-19 LAB — WET PREP, GENITAL
Sperm: NONE SEEN
YEAST WET PREP: NONE SEEN

## 2015-04-19 LAB — URINE MICROSCOPIC-ADD ON

## 2015-04-19 LAB — PREGNANCY, URINE: PREG TEST UR: NEGATIVE

## 2015-04-19 MED ORDER — OXYCODONE-ACETAMINOPHEN 5-325 MG PO TABS
1.0000 | ORAL_TABLET | Freq: Once | ORAL | Status: AC
  Administered 2015-04-19: 1 via ORAL
  Filled 2015-04-19: qty 1

## 2015-04-19 MED ORDER — CIPROFLOXACIN HCL 500 MG PO TABS: 500.0000 mg | ORAL_TABLET | Freq: Two times a day (BID) | ORAL | Status: DC

## 2015-04-19 MED ORDER — CEFTRIAXONE SODIUM 1 G IJ SOLR
1.0000 g | Freq: Once | INTRAMUSCULAR | Status: AC
  Administered 2015-04-19: 1 g via INTRAMUSCULAR
  Filled 2015-04-19: qty 10

## 2015-04-19 MED ORDER — METRONIDAZOLE 500 MG PO TABS: 500.0000 mg | ORAL_TABLET | Freq: Two times a day (BID) | ORAL | Status: DC

## 2015-04-19 MED ORDER — DEXTROSE 5 % IV SOLN: 1.0000 g | Freq: Once | INTRAVENOUS | Status: DC

## 2015-04-19 NOTE — ED Provider Notes (Signed)
CSN: [id number]     Arrival date & time 04/19/15  1232 History   First MD Initiated Contact with Patient 04/19/15 1349     Chief Complaint  Patient presents with  . Abdominal Pain  . Dysuria  . Fever     (Consider location/radiation/quality/duration/timing/severity/associated sxs/prior Treatment) Patient is a 29 y.o. female presenting with abdominal pain, dysuria, and fever. The history is provided by the patient.  Abdominal Pain Associated symptoms: dysuria, fever and vaginal discharge   Associated symptoms: no chest pain, no chills, no cough, no diarrhea, no shortness of breath, no sore throat and no vomiting   Dysuria Associated symptoms: abdominal pain, fever and vaginal discharge   Associated symptoms: no flank pain and no vomiting   Fever Associated symptoms: dysuria   Associated symptoms: no chest pain, no chills, no confusion, no cough, no diarrhea, no headaches, no rash, no sore throat and no vomiting   patient c/o dysuria, suprapubic pain, and fever the past week. States was seen 2 weeks ago w same, symptoms partially improved for a few days, but now recurred. +sexually active, no known std exposure. No birth control. +vaginal discharge. States does not know when last period was. No back or flank pain. No upper abd pain. Denies cough or uri c/o. No headaches. No cp or sob. No rash.       Past Medical History  Diagnosis Date  . Dysplasia of cervix, low grade (CIN 1)   . Pre-eclampsia   . Hepatitis C    Past Surgical History  Procedure Laterality Date  . C section x2    . Knee surgery    . Cesarean section    . Knee surgery    . Incontinence surgery    . Tonsillectomy    . Cesarean section  04/02/2011    Procedure: CESAREAN SECTION;  Surgeon: Bing Plume, MD;  Location: [facility];  Service: Gynecology;  Laterality: N/A;  Repeat of baby boy at [facility] 9/9   History reviewed. No pertinent family history. Social History  Substance Use Topics  . Smoking  status: Current Some Day Smoker  . Smokeless tobacco: None  . Alcohol Use: No   OB History    Gravida Para Term Preterm AB TAB SAB Ectopic Multiple Living   Review of Systems  Constitutional: Positive for fever. Negative for chills.  HENT: Negative for sore throat.   Eyes: Negative for discharge and redness.  Respiratory: Negative for cough and shortness of breath.   Cardiovascular: Negative for chest pain.  Gastrointestinal: Positive for abdominal pain. Negative for vomiting and diarrhea.  Endocrine: Negative for polyuria.  Genitourinary: Positive for dysuria and vaginal discharge. Negative for flank pain.  Musculoskeletal: Negative for back pain and neck pain.  Skin: Negative for rash.  Neurological: Negative for headaches.  Hematological: Does not bruise/bleed easily.  Psychiatric/Behavioral: Negative for confusion.      Allergies  Review of patient's allergies indicates no known allergies.  Home Medications   Prior to Admission medications   Medication Sig Start Date End Date Taking? Authorizing Provider  ibuprofen (ADVIL,MOTRIN) 200 MG tablet Take 400 mg by mouth every 6 (six) hours as needed for headache, mild pain or moderate pain.   Yes Historical Provider, MD  Pseudoeph-Doxylamine-DM-APAP (NYQUIL PO) Take 30 mLs by mouth every 6 (six) hours as needed (cold/flu symptoms).   Yes Historical Provider, MD  cephALEXin (KEFLEX) 500 MG capsule Take  1 capsule (500 mg total) by mouth 4 (four) times daily. Patient not taking: Reported on 04/19/2015 04/02/15   Marily Memos, MD  ibuprofen (ADVIL,MOTRIN) 600 MG tablet Take 1 tablet (600 mg total) by mouth every 6 (six) hours as needed for moderate pain. Patient not taking: Reported on 04/19/2015 04/02/15   Marily Memos, MD  phenazopyridine (PYRIDIUM) 200 MG tablet Take 1 tablet (200 mg total) by mouth 3 (three) times daily. Patient not taking: Reported on 04/19/2015 04/02/15   Marily Memos, MD  traMADol  (ULTRAM) 50 MG tablet Take 1 tablet (50 mg total) by mouth every 6 (six) hours as needed for severe pain. Patient not taking: Reported on 04/19/2015 04/02/15   Marily Memos, MD   BP 110/61 mmHg  Pulse 73  Temp(Src) 98.9 F (37.2 C) (Oral)  Resp 16  SpO2 100%  LMP 04/01/2015 (Exact Date) Physical Exam  Constitutional: She appears well-developed and well-nourished. No distress.  HENT:  Mouth/Throat: Oropharynx is clear and moist.  Eyes: Conjunctivae are normal. No scleral icterus.  Neck: Neck supple. No tracheal deviation present.  Cardiovascular: Normal rate, regular rhythm, normal heart sounds and intact distal pulses.  Exam reveals no gallop and no friction rub.   No murmur heard. Pulmonary/Chest: Effort normal and breath sounds normal. No respiratory distress.  Abdominal: Soft. Normal appearance and bowel sounds are normal. She exhibits no distension and no mass. There is tenderness. There is no rebound and no guarding.  Mild suprapubic tenderness.   Genitourinary:  No cva tenderness. Moderate yellowish vaginal discharge. +cervicitis. Mild cmt. No focal adx mass/tenderness.   Musculoskeletal: She exhibits no edema.  Neurological: She is alert.  Skin: Skin is warm and dry. No rash noted. She is not diaphoretic.  Psychiatric: She has a normal mood and affect.  Nursing note and vitals reviewed.   ED Course  Procedures (including critical care time) Labs Review  Results for orders placed or performed during the hospital encounter of 04/19/15  Wet prep, genital  Result Value Ref Range   Yeast Wet Prep HPF POC NONE SEEN NONE SEEN   Trich, Wet Prep PRESENT (A) NONE SEEN   Clue Cells Wet Prep HPF POC PRESENT (A) NONE SEEN   WBC, Wet Prep HPF POC MANY (A) NONE SEEN   Sperm NONE SEEN   Urinalysis, Routine w reflex microscopic-may I&O cath if menses (not at [facility])  Result Value Ref Range   Color, Urine YELLOW YELLOW   APPearance CLOUDY (A) CLEAR   Specific Gravity, Urine 1.009  1.005 - 1.030   pH 6.0 5.0 - 8.0   Glucose, UA NEGATIVE NEGATIVE mg/dL   Hgb urine dipstick SMALL (A) NEGATIVE   Bilirubin Urine NEGATIVE NEGATIVE   Ketones, ur NEGATIVE NEGATIVE mg/dL   Protein, ur NEGATIVE NEGATIVE mg/dL   Nitrite POSITIVE (A) NEGATIVE   Leukocytes, UA LARGE (A) NEGATIVE  Pregnancy, urine  Result Value Ref Range   Preg Test, Ur NEGATIVE NEGATIVE  CBC  Result Value Ref Range   WBC 12.1 (H) 4.0 - 10.5 K/uL   RBC 3.72 (L) 3.87 - 5.11 MIL/uL   Hemoglobin 10.5 (L) 12.0 - 15.0 g/dL   HCT 16.1 (L) 09.6 - 04.5 %   MCV 87.1 78.0 - 100.0 fL   MCH 28.2 26.0 - 34.0 pg   MCHC 32.4 30.0 - 36.0 g/dL   RDW 40.9 (H) 81.1 - 91.4 %   Platelets 215 150 - 400 K/uL  Urine microscopic-add on  Result Value Ref Range  Squamous Epithelial / LPF 0-5 (A) NONE SEEN   WBC, UA TOO NUMEROUS TO COUNT 0 - 5 WBC/hpf   RBC / HPF 0-5 0 - 5 RBC/hpf   Bacteria, UA MANY (A) NONE SEEN   Urine-Other TRICHOMONAS PRESENT        I have personally reviewed and evaluated these lab results as part of my medical decision-making.   MDM   Labs.  Reviewed nursing notes and prior charts for additional history.   On review prior ed visit, pt with trich and clue cells - does not appears received rx.    ua positive.  Rocephin iv.  Urine culture.  Pt asking for crackers and drinks - tolerates well.  Recheck pt comfortable. No nv. Afeb. Normal vitals.  Wet prep positive.   Pt had recent gc resulted - it was neg.  Will rx cipro and flagyl and home.   Pt currently appears stable for d/c.  Return precautions provided.     Cathren LaineKevin Wendy Mikles, MD 04/19/15 [phone]

## 2015-04-19 NOTE — Discharge Instructions (Signed)
It was our pleasure to provide your ER care today - we hope that you feel better.  Take antibiotics (cipro and flagyl) as prescribed - you must complete the full course of these antibiotics.   Do not drink alcohol when taking these antibiotics.   Take motrin or aleve as need for pain.  No sex until after symptoms have completely resolved, and then use protection/condoms.   Have any sexual contact(s) checked by their doctor or health department.   Follow up with primary care doctor in the next couple days for recheck.  For ob/gyn care, and for recheck in next few days if symptoms fail to resolve,  follow up at [facility] ob/gyn.  Return to ER right away if worse, new symptoms, high fevers, vomiting, worsening or severe pain, other concern.   You were given pain medication in the ER - no driving for the next 4 hours.      Urinary Tract Infection Urinary tract infections (UTIs) can develop anywhere along your urinary tract. Your urinary tract is your body's drainage system for removing wastes and extra water. Your urinary tract includes two kidneys, two ureters, a bladder, and a urethra. Your kidneys are a pair of bean-shaped organs. Each kidney is about the size of your fist. They are located below your ribs, one on each side of your spine. CAUSES Infections are caused by microbes, which are microscopic organisms, including fungi, viruses, and bacteria. These organisms are so small that they can only be seen through a microscope. Bacteria are the microbes that most commonly cause UTIs. SYMPTOMS  Symptoms of UTIs may vary by age and gender of the patient and by the location of the infection. Symptoms in young women typically include a frequent and intense urge to urinate and a painful, burning feeling in the bladder or urethra during urination. Older women and men are more likely to be tired, shaky, and weak and have muscle aches and abdominal pain. A fever may mean the infection is in  your kidneys. Other symptoms of a kidney infection include pain in your back or sides below the ribs, nausea, and vomiting. DIAGNOSIS To diagnose a UTI, your caregiver will ask you about your symptoms. Your caregiver will also ask you to provide a urine sample. The urine sample will be tested for bacteria and white blood cells. White blood cells are made by your body to help fight infection. TREATMENT  Typically, UTIs can be treated with medication. Because most UTIs are caused by a bacterial infection, they usually can be treated with the use of antibiotics. The choice of antibiotic and length of treatment depend on your symptoms and the type of bacteria causing your infection. HOME CARE INSTRUCTIONS  If you were prescribed antibiotics, take them exactly as your caregiver instructs you. Finish the medication even if you feel better after you have only taken some of the medication.  Drink enough water and fluids to keep your urine clear or pale yellow.  Avoid caffeine, tea, and carbonated beverages. They tend to irritate your bladder.  Empty your bladder often. Avoid holding urine for long periods of time.  Empty your bladder before and after sexual intercourse.  After a bowel movement, women should cleanse from front to back. Use each tissue only once. SEEK MEDICAL CARE IF:   You have back pain.  You develop a fever.  Your symptoms do not begin to resolve within 3 days. SEEK IMMEDIATE MEDICAL CARE IF:   You have severe back pain  or lower abdominal pain.  You develop chills.  You have nausea or vomiting.  You have continued burning or discomfort with urination. MAKE SURE YOU:   Understand these instructions.  Will watch your condition.  Will get help right away if you are not doing well or get worse.   This information is not intended to replace advice given to you by your health care provider. Make sure you discuss any questions you have with your health care provider.     Document Released: 02/28/2005 Document Revised: 02/09/2015 Document Reviewed: 06/29/2011 Elsevier Interactive Patient Education 2016 [employer].    Vaginitis Vaginitis is an inflammation of the vagina. It is most often caused by a change in the normal balance of the bacteria and yeast that live in the vagina. This change in balance causes an overgrowth of certain bacteria or yeast, which causes the inflammation. There are different types of vaginitis, but the most common types are:  Bacterial vaginosis.  Yeast infection (candidiasis).  Trichomoniasis vaginitis. This is a sexually transmitted infection (STI).  Viral vaginitis.  Atrophic vaginitis.  Allergic vaginitis. CAUSES  The cause depends on the type of vaginitis. Vaginitis can be caused by:  Bacteria (bacterial vaginosis).  Yeast (yeast infection).  A parasite (trichomoniasis vaginitis)  A virus (viral vaginitis).  Low hormone levels (atrophic vaginitis). Low hormone levels can occur during pregnancy, breastfeeding, or after menopause.  Irritants, such as bubble baths, scented tampons, and feminine sprays (allergic vaginitis). Other factors can change the normal balance of the yeast and bacteria that live in the vagina. These include:  Antibiotic medicines.  Poor hygiene.  Diaphragms, vaginal sponges, spermicides, birth control pills, and intrauterine devices (IUD).  Sexual intercourse.  Infection.  Uncontrolled diabetes.  A weakened immune system. SYMPTOMS  Symptoms can vary depending on the cause of the vaginitis. Common symptoms include:  Abnormal vaginal discharge.  The discharge is white, gray, or yellow with bacterial vaginosis.  The discharge is thick, white, and cheesy with a yeast infection.  The discharge is frothy and yellow or greenish with trichomoniasis.  A bad vaginal odor.  The odor is fishy with bacterial vaginosis.  Vaginal itching, pain, or swelling.  Painful  intercourse.  Pain or burning when urinating. Sometimes, there are no symptoms. TREATMENT  Treatment will vary depending on the type of infection.   Bacterial vaginosis and trichomoniasis are often treated with antibiotic creams or pills.  Yeast infections are often treated with antifungal medicines, such as vaginal creams or suppositories.  Viral vaginitis has no cure, but symptoms can be treated with medicines that relieve discomfort. Your sexual partner should be treated as well.  Atrophic vaginitis may be treated with an estrogen cream, pill, suppository, or vaginal ring. If vaginal dryness occurs, lubricants and moisturizing creams may help. You may be told to avoid scented soaps, sprays, or douches.  Allergic vaginitis treatment involves quitting the use of the product that is causing the problem. Vaginal creams can be used to treat the symptoms. HOME CARE INSTRUCTIONS   Take all medicines as directed by your caregiver.  Keep your genital area clean and dry. Avoid soap and only rinse the area with water.  Avoid douching. It can remove the healthy bacteria in the vagina.  Do not use tampons or have sexual intercourse until your vaginitis has been treated. Use sanitary pads while you have vaginitis.  Wipe from front to back. This avoids the spread of bacteria from the rectum to the vagina.  Let air reach  your genital area.  Wear cotton underwear to decrease moisture buildup.  Avoid wearing underwear while you sleep until your vaginitis is gone.  Avoid tight pants and underwear or nylons without a cotton panel.  Take off wet clothing (especially bathing suits) as soon as possible.  Use mild, non-scented products. Avoid using irritants, such as:  Scented feminine sprays.  Fabric softeners.  Scented detergents.  Scented tampons.  Scented soaps or bubble baths.  Practice safe sex and use condoms. Condoms may prevent the spread of trichomoniasis and viral  vaginitis. SEEK MEDICAL CARE IF:   You have abdominal pain.  You have a fever or persistent symptoms for more than 2-3 days.  You have a fever and your symptoms suddenly get worse.   This information is not intended to replace advice given to you by your health care provider. Make sure you discuss any questions you have with your health care provider.   Document Released: 03/18/2007 Document Revised: 10/05/2014 Document Reviewed: 11/01/2011 Elsevier Interactive Patient Education 2016 Elsevier Inc.    Cervicitis Cervicitis is a soreness and swelling (inflammation) of the cervix. Your cervix is located at the bottom of your uterus. It opens up to the vagina. CAUSES   Sexually transmitted infections (STIs).   Allergic reaction.   Medicines or birth control devices that are put in the vagina.   Injury to the cervix.   Bacterial infections.  RISK FACTORS You are at greater risk if you:  Have unprotected sexual intercourse.  Have sexual intercourse with many partners.  Began sexual intercourse at an early age.  Have a history of STIs. SYMPTOMS  There may be no symptoms. If symptoms occur, they may include:   Gray, white, yellow, or bad-smelling vaginal discharge.   Pain or itching of the area outside the vagina.   Painful sexual intercourse.   Lower abdominal or lower back pain, especially during intercourse.   Frequent urination.   Abnormal vaginal bleeding between periods, after sexual intercourse, or after menopause.   Pressure or a heavy feeling in the pelvis.  DIAGNOSIS  Diagnosis is made after a pelvic exam. Other tests may include:   Examination of any discharge under a microscope (wet prep).   A Pap test.  TREATMENT  Treatment will depend on the cause of cervicitis. If it is caused by an STI, both you and your partner will need to be treated. Antibiotic medicines will be given.  HOME CARE INSTRUCTIONS   Do not have sexual intercourse  until your health care provider says it is okay.   Do not have sexual intercourse until your partner has been treated, if your cervicitis is caused by an STI.   Take your antibiotics as directed. Finish them even if you start to feel better.  SEEK MEDICAL CARE IF:  Your symptoms come back.   You have a fever.  MAKE SURE YOU:   Understand these instructions.  Will watch your condition.  Will get help right away if you are not doing well or get worse.   This information is not intended to replace advice given to you by your health care provider. Make sure you discuss any questions you have with your health care provider.   Document Released: 05/21/2005 Document Revised: 05/26/2013 Document Reviewed: 11/12/2012 Elsevier Interactive Patient Education 2016 Elsevier Inc.    Pelvic Inflammatory Disease Pelvic inflammatory disease (PID) refers to an infection in some or all of the female organs. The infection can be in the uterus, ovaries, fallopian tubes,  or the surrounding tissues in the pelvis. PID can cause abdominal or pelvic pain that comes on suddenly (acute pelvic pain). PID is a serious infection because it can lead to lasting (chronic) pelvic pain or the inability to have children (infertility). CAUSES This condition is most often caused by an infection that is spread during sexual contact. However, the infection can also be caused by the normal bacteria that are found in the vaginal tissues if these bacteria travel upward into the reproductive organs. PID can also occur following:  The birth of a baby.  A miscarriage.  An abortion.  Major pelvic surgery.  The use of an intrauterine device (IUD).  A sexual assault. RISK FACTORS This condition is more likely to develop in women who:  Are younger than 30 years of age.  [facility].  Use nonbarrier contraception.  Have multiple sexual partners.  Have sex with someone who has symptoms of  an STD (sexually transmitted disease).  Use oral contraception. At times, certain behaviors can also increase the possibility of getting PID, such as:  Using a vaginal douche.  Having an IUD in place. SYMPTOMS Symptoms of this condition include:  Abdominal or pelvic pain.  Fever.  Chills.  Abnormal vaginal discharge.  Abnormal uterine bleeding.  Unusual pain shortly after the end of a menstrual period.  Painful urination.  Pain with sexual intercourse.  Nausea and vomiting. DIAGNOSIS To diagnose this condition, your health care provider will do a physical exam and take your medical history. A pelvic exam typically reveals great tenderness in the uterus and the surrounding pelvic tissues. You may also have tests, such as:  Lab tests, including a pregnancy test, blood tests, and urine test.  Culture tests of the vagina and cervix to check for an STD.  Ultrasound.  A laparoscopic procedure to look inside the pelvis.  Examining vaginal secretions under a microscope. TREATMENT Treatment for this condition may involve one or more approaches.  Antibiotic medicines may be prescribed to be taken by mouth.  Sexual partners may need to be treated if the infection is caused by an STD.  For more severe cases, hospitalization may be needed to give antibiotics directly into a vein through an IV tube.  Surgery may be needed if other treatments do not help, but this is rare. It may take weeks until you are completely well. If you are diagnosed with PID, you should also be checked for human immunodeficiency virus (HIV). Your health care provider may test you for infection again 3 months after treatment. You should not have unprotected sex. HOME CARE INSTRUCTIONS  Take over-the-counter and prescription medicines only as told by your health care provider.  If you were prescribed an antibiotic medicine, take it as told by your health care provider. Do not stop taking the antibiotic  even if you start to feel better.  Do not have sexual intercourse until treatment is completed or as told by your health care provider. If PID is confirmed, your recent sexual partners will need treatment, especially if you had unprotected sex.  Keep all follow-up visits as told by your health care provider. This is important. SEEK MEDICAL CARE IF:  You have increased or abnormal vaginal discharge.  Your pain does not improve.  You vomit.  You have a fever.  You cannot tolerate your medicines.  Your partner has an STD.  You have pain when you urinate. SEEK IMMEDIATE MEDICAL CARE IF:  You have increased abdominal or  pelvic pain.  You have chills.  Your symptoms are not better in 72 hours even with treatment.   This information is not intended to replace advice given to you by your health care provider. Make sure you discuss any questions you have with your health care provider.   Document Released: 05/21/2005 Document Revised: 02/09/2015 Document Reviewed: 06/28/2014 Elsevier Interactive Patient Education 2016 [employer].    Safe Sex Safe sex is about reducing the risk of giving or getting a sexually transmitted disease (STD). STDs are spread through sexual contact involving the genitals, mouth, or rectum. Some STDs can be cured and others cannot. Safe sex can also prevent unintended pregnancies.  WHAT ARE SOME SAFE SEX PRACTICES?  Limit your sexual activity to only one partner who is having sex with only you.  Talk to your partner about his or her past partners, past STDs, and drug use.  Use a condom every time you have sexual intercourse. This includes vaginal, oral, and anal sexual activity. Both females and males should wear condoms during oral sex. Only use latex or polyurethane condoms and water-based lubricants. Using petroleum-based lubricants or oils to lubricate a condom will weaken the condom and increase the chance that it will break. The condom should be in  place from the beginning to the end of sexual activity. Wearing a condom reduces, but does not completely eliminate, your risk of getting or giving an STD. STDs can be spread by contact with infected body fluids and skin.  Get vaccinated for hepatitis B and HPV.  Avoid alcohol and recreational drugs, which can affect your judgment. You may forget to use a condom or participate in high-risk sex.  For females, avoid douching after sexual intercourse. Douching can spread an infection farther into the reproductive tract.  Check your body for signs of sores, blisters, rashes, or unusual discharge. See your health care provider if you notice any of these signs.  Avoid sexual contact if you have symptoms of an infection or are being treated for an STD. If you or your partner has herpes, avoid sexual contact when blisters are present. Use condoms at all other times.  If you are at risk of being infected with HIV, it is recommended that you take a prescription medicine daily to prevent HIV infection. This is called pre-exposure prophylaxis (PrEP). You are considered at risk if:  You are a man who has sex with other men (MSM).  You are a heterosexual man or woman who is sexually active with more than one partner.  You take drugs by injection.  You are sexually active with a partner who has HIV.  Talk with your health care provider about whether you are at high risk of being infected with HIV. If you choose to begin PrEP, you should first be tested for HIV. You should then be tested every 3 months for as long as you are taking PrEP.  See your health care provider for regular screenings, exams, and tests for other STDs. Before having sex with a new partner, each of you should be screened for STDs and should talk about the results with each other. WHAT ARE THE BENEFITS OF SAFE SEX?   There is less chance of getting or giving an STD.  You can prevent unwanted or unintended pregnancies.  By  discussing safe sex concerns with your partner, you may increase feelings of intimacy, comfort, trust, and honesty between the two of you.   This information is not intended to replace  advice given to you by your health care provider. Make sure you discuss any questions you have with your health care provider.   Document Released: 06/28/2004 Document Revised: 06/11/2014 Document Reviewed: 11/12/2011 Elsevier Interactive Patient Education [employer]

## 2015-04-19 NOTE — ED Notes (Signed)
Patient c/o lower abd pain x2 weeks, was seent 2 weeks prior for same symptoms.  Patient states that has frequent, painful urination with large amounts of vaginal discharge.  Patient states pain scale 5/10.  NAD at this time.

## 2015-04-19 NOTE — ED Notes (Signed)
Patient states she was seen in the ED for the same 2 weeks ago. Patient states she continues to have fever, dysuria, and lower bilateral abdominal pain. Patient states she has been having a large amount of clear vaginal discharge.

## 2015-04-19 NOTE — ED Notes (Signed)
Patient d/c'd to home.  F/u care discussed, medications discussed, patient verbalized understanding.

## 2015-04-20 LAB — GC/CHLAMYDIA PROBE AMP (~~LOC~~) NOT AT ARMC
Chlamydia: NEGATIVE
NEISSERIA GONORRHEA: NEGATIVE

## 2015-04-21 LAB — URINE CULTURE

## 2015-04-22 ENCOUNTER — Telehealth (HOSPITAL_COMMUNITY): Payer: Self-pay

## 2015-04-22 NOTE — Telephone Encounter (Signed)
Post ED Visit - Positive Culture Follow-up  Culture report reviewed by antimicrobial stewardship pharmacist:  []  Kathy Reed, Pharm.D. []  Kathy Reed, Pharm.D., BCPS []  Kathy Reed, Pharm.D. []  Kathy Reed, Pharm.D., BCPS []  Kathy Reed, [place] BCPS, AAHIVP []  Kathy Reed, Pharm.D., BCPS, AAHIVP []  Kathy Reed, Pharm.D. []  Kathy Reed, [address]Ph[address] Kathy Reed  Kathy Reed, Pharm.D.  Positive urine culture, >/= 100,000 colonies -> E Coli Treated with Ciprofloxacin, organism sensitive to the same and no further patient follow-up is required at this time.  Kathy Reed, Kathy Reed 04/22/2015, 9:33 AM

## 2015-05-06 ENCOUNTER — Other Ambulatory Visit: Payer: Self-pay

## 2015-11-23 ENCOUNTER — Emergency Department (HOSPITAL_COMMUNITY)
Admission: EM | Admit: 2015-11-23 | Discharge: 2015-11-23 | Disposition: A | Discharge status: Home / Self Care | Payer: Medicaid Other | Attending: Emergency Medicine | Admitting: Emergency Medicine

## 2015-11-23 ENCOUNTER — Encounter (HOSPITAL_COMMUNITY): Payer: Self-pay | Admitting: Emergency Medicine

## 2015-11-23 DIAGNOSIS — F17200 Nicotine dependence, unspecified, uncomplicated: Secondary | ICD-10-CM | POA: Insufficient documentation

## 2015-11-23 DIAGNOSIS — N390 Urinary tract infection, site not specified: Secondary | ICD-10-CM

## 2015-11-23 DIAGNOSIS — R300 Dysuria: Secondary | ICD-10-CM | POA: Diagnosis present

## 2015-11-23 DIAGNOSIS — B9689 Other specified bacterial agents as the cause of diseases classified elsewhere: Secondary | ICD-10-CM

## 2015-11-23 DIAGNOSIS — N730 Acute parametritis and pelvic cellulitis: Secondary | ICD-10-CM

## 2015-11-23 DIAGNOSIS — N760 Acute vaginitis: Secondary | ICD-10-CM | POA: Insufficient documentation

## 2015-11-23 LAB — WET PREP, GENITAL
SPERM: NONE SEEN
Trich, Wet Prep: NONE SEEN
YEAST WET PREP: NONE SEEN

## 2015-11-23 LAB — CBC WITH DIFFERENTIAL/PLATELET
BASOS ABS: 0 10*3/uL (ref 0.0–0.1)
BASOS PCT: 0 %
EOS ABS: 0.2 10*3/uL (ref 0.0–0.7)
EOS PCT: 4 %
HCT: 33.1 % — ABNORMAL LOW (ref 36.0–46.0)
Hemoglobin: 11.2 g/dL — ABNORMAL LOW (ref 12.0–15.0)
LYMPHS PCT: 26 %
Lymphs Abs: 1.8 10*3/uL (ref 0.7–4.0)
MCH: 29.2 pg (ref 26.0–34.0)
MCHC: 33.8 g/dL (ref 30.0–36.0)
MCV: 86.2 fL (ref 78.0–100.0)
MONO ABS: 0.7 10*3/uL (ref 0.1–1.0)
Monocytes Relative: 10 %
Neutro Abs: 4.1 10*3/uL (ref 1.7–7.7)
Neutrophils Relative %: 60 %
PLATELETS: 208 10*3/uL (ref 150–400)
RBC: 3.84 MIL/uL — AB (ref 3.87–5.11)
RDW: 13.1 % (ref 11.5–15.5)
WBC: 6.9 10*3/uL (ref 4.0–10.5)

## 2015-11-23 LAB — URINE MICROSCOPIC-ADD ON

## 2015-11-23 LAB — URINALYSIS, ROUTINE W REFLEX MICROSCOPIC
BILIRUBIN URINE: NEGATIVE
Glucose, UA: NEGATIVE mg/dL
Ketones, ur: NEGATIVE mg/dL
NITRITE: POSITIVE — AB
PH: 5.5 (ref 5.0–8.0)
Protein, ur: NEGATIVE mg/dL
SPECIFIC GRAVITY, URINE: 1.029 (ref 1.005–1.030)

## 2015-11-23 LAB — I-STAT CHEM 8, ED
BUN: 14 mg/dL (ref 6–20)
CHLORIDE: 98 mmol/L — AB (ref 101–111)
Calcium, Ion: 1.17 mmol/L (ref 1.12–1.23)
Creatinine, Ser: 0.8 mg/dL (ref 0.44–1.00)
GLUCOSE: 104 mg/dL — AB (ref 65–99)
HEMATOCRIT: 34 % — AB (ref 36.0–46.0)
HEMOGLOBIN: 11.6 g/dL — AB (ref 12.0–15.0)
POTASSIUM: 3.6 mmol/L (ref 3.5–5.1)
SODIUM: 141 mmol/L (ref 135–145)
TCO2: 31 mmol/L (ref 0–100)

## 2015-11-23 LAB — RAPID URINE DRUG SCREEN, HOSP PERFORMED
Amphetamines: NOT DETECTED
BENZODIAZEPINES: NOT DETECTED
Barbiturates: NOT DETECTED
Cocaine: POSITIVE — AB
Opiates: POSITIVE — AB
Tetrahydrocannabinol: NOT DETECTED

## 2015-11-23 LAB — POC URINE PREG, ED: Preg Test, Ur: NEGATIVE

## 2015-11-23 MED ORDER — METRONIDAZOLE 500 MG PO TABS
500.0000 mg | ORAL_TABLET | Freq: Once | ORAL | Status: AC
  Administered 2015-11-23: 500 mg via ORAL
  Filled 2015-11-23: qty 1

## 2015-11-23 MED ORDER — CEPHALEXIN 500 MG PO CAPS: 500.0000 mg | ORAL_CAPSULE | Freq: Two times a day (BID) | ORAL | Status: DC

## 2015-11-23 MED ORDER — DOXYCYCLINE HYCLATE 100 MG PO TABS: 100.0000 mg | ORAL_TABLET | Freq: Once | ORAL | Status: DC

## 2015-11-23 MED ORDER — CEFTRIAXONE SODIUM 250 MG IJ SOLR
250.0000 mg | Freq: Once | INTRAMUSCULAR | Status: AC
  Administered 2015-11-23: 250 mg via INTRAMUSCULAR
  Filled 2015-11-23: qty 250

## 2015-11-23 MED ORDER — SODIUM CHLORIDE 0.9 % IV BOLUS (SEPSIS)
1000.0000 mL | Freq: Once | INTRAVENOUS | Status: AC
  Administered 2015-11-23: 1000 mL via INTRAVENOUS

## 2015-11-23 MED ORDER — DOXYCYCLINE HYCLATE 100 MG PO TABS
100.0000 mg | ORAL_TABLET | Freq: Once | ORAL | Status: AC
  Administered 2015-11-23: 100 mg via ORAL
  Filled 2015-11-23: qty 1

## 2015-11-23 MED ORDER — METRONIDAZOLE 500 MG PO TABS: 500.0000 mg | ORAL_TABLET | Freq: Two times a day (BID) | ORAL | Status: DC

## 2015-11-23 MED ORDER — LIDOCAINE HCL 1 % IJ SOLN
INTRAMUSCULAR | Status: AC
  Administered 2015-11-23: 0.9 mL
  Filled 2015-11-23: qty 20

## 2015-11-23 MED ORDER — FOSFOMYCIN TROMETHAMINE 3 G PO PACK
3.0000 g | PACK | Freq: Once | ORAL | Status: AC
  Administered 2015-11-23: 3 g via ORAL
  Filled 2015-11-23: qty 3

## 2015-11-23 MED ORDER — DOXYCYCLINE HYCLATE 100 MG PO CAPS: 100.0000 mg | ORAL_CAPSULE | Freq: Two times a day (BID) | ORAL | Status: DC

## 2015-11-23 MED ORDER — METRONIDAZOLE 500 MG PO TABS: 500.0000 mg | ORAL_TABLET | Freq: Once | ORAL | Status: DC

## 2015-11-23 NOTE — ED Notes (Signed)
Pt reports dysuria and and foul smelling vaginal discharge for over a week with intermittent fever. No fever in triage, has not taken any antipyretics at home today.

## 2015-11-23 NOTE — Discharge Instructions (Signed)

## 2015-11-23 NOTE — ED Provider Notes (Signed)
CSN: [id number]     Arrival date & time 11/23/15  1058 History   First MD Initiated Contact with Patient 11/23/15 1149     Chief Complaint  Patient presents with  . Vaginal Discharge  . Dysuria   HPI Comments: 30 year old female presents with vaginal discharge and dysuria for the past month. She is an unreliable historian. Past medical history significant for heroin abuse, cervical dysplasia, and PID. She denies hx of STD however this is evident in her chart. She reports associated dyspareunia and foul-smelling urine. Denies fever, chills, abdominal pain, flank pain, nausea, vomiting, diarrhea, vaginal bleeding.  Patient is a 30 y.o. female presenting with vaginal discharge and dysuria.  Vaginal Discharge Associated symptoms: dyspareunia and dysuria   Associated symptoms: no abdominal pain, no fever, no nausea and no vomiting   Dysuria Associated symptoms: vaginal discharge   Associated symptoms: no abdominal pain, no fever, no flank pain, no nausea and no vomiting     Past Medical History  Diagnosis Date  . Dysplasia of cervix, low grade (CIN 1)   . Pre-eclampsia   . Hepatitis C    Past Surgical History  Procedure Laterality Date  . C section x2    . Knee surgery    . Cesarean section    . Knee surgery    . Incontinence surgery    . Tonsillectomy    . Cesarean section  04/02/2011    Procedure: CESAREAN SECTION;  Surgeon: Bing Plume, MD;  Location: [facility];  Service: Gynecology;  Laterality: N/A;  Repeat [facility] baby boy at [facility] 9/9   History reviewed. No pertinent family history. Social History  Substance Use Topics  . Smoking status: Current Some Day Smoker  . Smokeless tobacco: None  . Alcohol Use: No   OB History    Gravida Para Term Preterm AB TAB SAB Ectopic Multiple Living   5 2 2  2  2   3      Review of Systems  Constitutional: Negative for fever and chills.  Respiratory: Negative for shortness of breath.   Cardiovascular: Negative for chest pain.   Gastrointestinal: Negative for nausea, vomiting and abdominal pain.  Genitourinary: Positive for dysuria, vaginal discharge, vaginal pain and dyspareunia. Negative for hematuria, flank pain, vaginal bleeding and pelvic pain.  Skin: Negative for rash.  All other systems reviewed and are negative.     Allergies  Review of patient's allergies indicates no known allergies.  Home Medications   Prior to Admission medications   Medication Sig Start Date End Date Taking? Authorizing Provider  doxycycline (VIBRAMYCIN) 100 MG capsule Take 1 capsule (100 mg total) by mouth 2 (two) times daily. 11/23/15   Bethel Born, PA-C  metroNIDAZOLE (FLAGYL) 500 MG tablet Take 1 tablet (500 mg total) by mouth 2 (two) times daily. 11/23/15   Bethel Born, PA-C   BP 104/69 mmHg  Pulse 64  Temp(Src) 98.6 F (37 C) (Oral)  Resp 18  SpO2 100%   Physical Exam  Constitutional: She is oriented to person, place, and time. She appears well-developed and well-nourished. No distress.  Drowsy - must be aroused every time I walk in room  HENT:  Head: Normocephalic and atraumatic.  Eyes: Conjunctivae are normal. Pupils are equal, round, and reactive to light. Right eye exhibits no discharge. Left eye exhibits no discharge. No scleral icterus.  Neck: Normal range of motion.  Cardiovascular: Normal rate and regular rhythm.  Exam reveals no gallop and no friction rub.  No murmur heard. Pulmonary/Chest: Effort normal and breath sounds normal. No respiratory distress. She has no wheezes. She has no rales. She exhibits no tenderness.  Abdominal: Soft. Bowel sounds are normal. She exhibits no distension and no mass. There is no tenderness. There is no rebound and no guarding.  Genitourinary:  No inguinal lymphadenopathy or inguinal hernia noted. Normal external genitalia. Moderate amount of pain with speculum insertion. Closed cervical os with normal appearance - no rash or lesions. Yellowish discharge in cervix  and in vaginal vault. On bimanual examination no adnexal tenderness. She does have cervical motion tenderness. Chaperone present during exam.    Neurological: She is alert and oriented to person, place, and time.  Skin: Skin is warm and dry.  Psychiatric: Her affect is blunt. Her speech is slurred. She is inattentive.    ED Course  Procedures (including critical care time) Labs Review Labs Reviewed  WET PREP, GENITAL - Abnormal; Notable for the following:    Clue Cells Wet Prep HPF POC PRESENT (*)    WBC, Wet Prep HPF POC MANY (*)    All other components within normal limits  URINALYSIS, ROUTINE W REFLEX MICROSCOPIC (NOT AT [facility]) - Abnormal; Notable for the following:    APPearance CLOUDY (*)    Hgb urine dipstick TRACE (*)    Nitrite POSITIVE (*)    Leukocytes, UA MODERATE (*)    All other components within normal limits  URINE RAPID DRUG SCREEN, HOSP PERFORMED - Abnormal; Notable for the following:    Opiates POSITIVE (*)    Cocaine POSITIVE (*)    All other components within normal limits  CBC WITH DIFFERENTIAL/PLATELET - Abnormal; Notable for the following:    RBC 3.84 (*)    Hemoglobin 11.2 (*)    HCT 33.1 (*)    All other components within normal limits  URINE MICROSCOPIC-ADD ON - Abnormal; Notable for the following:    Squamous Epithelial / LPF 6-30 (*)    Bacteria, UA MANY (*)    Crystals CA OXALATE CRYSTALS (*)    All other components within normal limits  I-STAT CHEM 8, ED - Abnormal; Notable for the following:    Chloride 98 (*)    Glucose, Bld 104 (*)    Hemoglobin 11.6 (*)    HCT 34.0 (*)    All other components within normal limits  URINE CULTURE  POC URINE PREG, ED  GC/CHLAMYDIA PROBE AMP ([place]) NOT AT [facility]    Imaging Review No results found. I have personally reviewed and evaluated these images and lab results as part of my medical decision-making.   EKG Interpretation None      MDM   Final diagnoses:  PID (acute pelvic inflammatory  disease)  BV (bacterial vaginosis)  UTI (lower urinary tract infection)   30 year old female presents with PID, UTI, and BV. She is initially hypotensive - 1L NS given with improvement in SBP. She is afebrile not tachycardic or tachypneic and not hypoxic. CBC is remarkable for mild anemia there is no leukocytosis. Patient is not pregnant. UA remarkable for trace amount of hemoglobin, moderate leukocytes, positive nitrite. Urine culture sent. Wet prep remarkable for clue cells and many white blood cells. In the setting of this and having positive cervical motion tenderness will treat for PID. UDS is remarkable for opiates and cocaine. Patient states she is unable to afford her medicines. Consult to case management placed who recommended 3 dollar antibiotics. Rocephin and Fosfomycin given here. Will rx Doxy and  Flagyl for 2 weeks which are on the preferred Medicaid list. Patient verbalized understanding. Patient is NAD, non-toxic, with stable VS. Patient is informed of clinical course, understands medical decision making process, and agrees with plan. Opportunity for questions provided and all questions answered. Return precautions given.'    Bethel BornKelly Marie Delano Scardino, PA-C 11/23/15 1643  Lavera Guiseana Duo Liu, MD 11/23/15 [phone]

## 2015-11-23 NOTE — ED Notes (Signed)
Pelvic exam set up, patient aware we need urine and is getting her pants off

## 2015-11-23 NOTE — Progress Notes (Addendum)
ED CM Consulted by ED unit secretary for ED PA Np about medication assistance for pt CM spoke with ED PA/NP This is a medicaid pt and the recommended doxycycline is listed on the 2017 preferred medication list for medicaid patients (CM checked the formulary) therefore pt should be able to obtain this medication at her medicaid co pay of $0-$3 There is not a [facility] program to assist with medicaid co pays Pt offered a list of financial a resources and [facility] information Pt [facility] and resources available there to assist her

## 2015-11-24 LAB — GC/CHLAMYDIA PROBE AMP (~~LOC~~) NOT AT ARMC
CHLAMYDIA, DNA PROBE: POSITIVE — AB
Neisseria Gonorrhea: NEGATIVE

## 2015-11-25 ENCOUNTER — Telehealth: Payer: Self-pay | Admitting: *Deleted

## 2015-11-25 LAB — URINE CULTURE: Culture: >=100000 — AB

## 2015-11-25 NOTE — ED Notes (Signed)
(+)  Chlamydia, no answer at phone attempt, letter sent.

## 2015-11-26 ENCOUNTER — Telehealth (HOSPITAL_BASED_OUTPATIENT_CLINIC_OR_DEPARTMENT_OTHER): Payer: Self-pay

## 2015-11-26 NOTE — Telephone Encounter (Signed)
Post ED Visit - Positive Culture Follow-up  Culture report reviewed by antimicrobial stewardship pharmacist:  []  Kathy Reed, Pharm.D. []  Kathy Reed, Pharm.D., BCPS []  Kathy Reed, Pharm.D. []  Kathy Reed, Pharm.D., BCPS []  Cornwall BridgeMinh Reed, [address]Pharm[address]D., BCPS, AAHIVP []  Estella HuskMichelle Turner, Pharm.D., BCPS, AAHIVP []  Kathy Reed, [address]Pharm[address] []  Kathy Reed, [address]Ph[address] Kathy Reed Pharm D Positive Urine culture Treated with Cephalexin, organism sensitive to the same and no further patient follow-up is required at this time.  Kathy Reed, Kathy Reed 11/26/2015, 11:05 AM

## 2015-12-25 ENCOUNTER — Emergency Department (HOSPITAL_COMMUNITY): Payer: Medicaid Other

## 2015-12-25 ENCOUNTER — Emergency Department (HOSPITAL_COMMUNITY)
Admission: EM | Admit: 2015-12-25 | Discharge: 2015-12-25 | Disposition: A | Discharge status: Home / Self Care | Payer: Medicaid Other | Attending: Emergency Medicine | Admitting: Emergency Medicine

## 2015-12-25 ENCOUNTER — Encounter: Payer: Self-pay | Admitting: Emergency Medicine

## 2015-12-25 DIAGNOSIS — Y929 Unspecified place or not applicable: Secondary | ICD-10-CM | POA: Diagnosis not present

## 2015-12-25 DIAGNOSIS — W098XXA Fall on or from other playground equipment, initial encounter: Secondary | ICD-10-CM | POA: Insufficient documentation

## 2015-12-25 DIAGNOSIS — S52602A Unspecified fracture of lower end of left ulna, initial encounter for closed fracture: Secondary | ICD-10-CM | POA: Diagnosis not present

## 2015-12-25 DIAGNOSIS — Y999 Unspecified external cause status: Secondary | ICD-10-CM | POA: Diagnosis not present

## 2015-12-25 DIAGNOSIS — Y9344 Activity, trampolining: Secondary | ICD-10-CM | POA: Insufficient documentation

## 2015-12-25 DIAGNOSIS — S59912A Unspecified injury of left forearm, initial encounter: Secondary | ICD-10-CM | POA: Diagnosis present

## 2015-12-25 DIAGNOSIS — F17200 Nicotine dependence, unspecified, uncomplicated: Secondary | ICD-10-CM | POA: Diagnosis not present

## 2015-12-25 DIAGNOSIS — S52202A Unspecified fracture of shaft of left ulna, initial encounter for closed fracture: Secondary | ICD-10-CM

## 2015-12-25 MED ORDER — HYDROCODONE-ACETAMINOPHEN 5-325 MG PO TABS: 1.0000 | ORAL_TABLET | Freq: Four times a day (QID) | ORAL | 0 refills | Status: DC | PRN

## 2015-12-25 MED ORDER — OXYCODONE-ACETAMINOPHEN 5-325 MG PO TABS
2.0000 | ORAL_TABLET | Freq: Once | ORAL | Status: AC
  Administered 2015-12-25: 2 via ORAL
  Filled 2015-12-25: qty 2

## 2015-12-25 MED ORDER — IBUPROFEN 600 MG PO TABS: 600.0000 mg | ORAL_TABLET | Freq: Three times a day (TID) | ORAL | 0 refills | Status: DC | PRN

## 2015-12-25 NOTE — ED Triage Notes (Signed)
Pt states that she hurt her L wrist/ forearm 2-3 days ago but states that she 'would rather not say how it happenened.' L arm does appear deformed. Alert and oriented.
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
## 2016-01-07 ENCOUNTER — Emergency Department (HOSPITAL_COMMUNITY): Payer: Medicaid Other

## 2016-01-07 ENCOUNTER — Encounter (HOSPITAL_COMMUNITY): Payer: Self-pay | Admitting: Emergency Medicine

## 2016-01-07 ENCOUNTER — Emergency Department (HOSPITAL_COMMUNITY)
Admission: EM | Admit: 2016-01-07 | Discharge: 2016-01-07 | Disposition: A | Discharge status: Home / Self Care | Payer: Medicaid Other | Attending: Emergency Medicine | Admitting: Emergency Medicine

## 2016-01-07 DIAGNOSIS — Y939 Activity, unspecified: Secondary | ICD-10-CM | POA: Diagnosis not present

## 2016-01-07 DIAGNOSIS — S52202G Unspecified fracture of shaft of left ulna, subsequent encounter for closed fracture with delayed healing: Secondary | ICD-10-CM

## 2016-01-07 DIAGNOSIS — Y999 Unspecified external cause status: Secondary | ICD-10-CM | POA: Diagnosis not present

## 2016-01-07 DIAGNOSIS — F17200 Nicotine dependence, unspecified, uncomplicated: Secondary | ICD-10-CM | POA: Insufficient documentation

## 2016-01-07 DIAGNOSIS — S52602G Unspecified fracture of lower end of left ulna, subsequent encounter for closed fracture with delayed healing: Secondary | ICD-10-CM | POA: Insufficient documentation

## 2016-01-07 DIAGNOSIS — S59912A Unspecified injury of left forearm, initial encounter: Secondary | ICD-10-CM | POA: Diagnosis present

## 2016-01-07 DIAGNOSIS — Y929 Unspecified place or not applicable: Secondary | ICD-10-CM | POA: Insufficient documentation

## 2016-01-07 MED ORDER — IBUPROFEN 600 MG PO TABS: 600.0000 mg | ORAL_TABLET | Freq: Three times a day (TID) | ORAL | 0 refills | Status: DC | PRN

## 2016-01-07 MED ORDER — MELOXICAM 7.5 MG PO TABS: 7.5000 mg | ORAL_TABLET | Freq: Every day | ORAL | 0 refills | Status: DC

## 2016-01-07 NOTE — Discharge Instructions (Signed)
Read the information below.  Use the prescribed medication as directed.  Please discuss all new medications with your pharmacist.  You may return to the Emergency Department at any time for worsening condition or any new symptoms that concern you.  If you develop uncontrolled pain, weakness or numbness of the extremity, severe discoloration of the skin, or you are unable to move your fingers, return to the ER for a recheck.    °
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
## 2016-01-07 NOTE — ED Notes (Signed)
[employer] at bedside 
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
## 2016-01-11 ENCOUNTER — Inpatient Hospital Stay (HOSPITAL_COMMUNITY)
Admission: AD | Admit: 2016-01-11 | Discharge: 2016-01-17 | DRG: 885 | Disposition: A | Discharge status: Home / Self Care | Payer: Medicaid Other | Source: Intra-hospital | Attending: Psychiatry | Admitting: Psychiatry

## 2016-01-11 ENCOUNTER — Encounter (HOSPITAL_COMMUNITY): Payer: Self-pay

## 2016-01-11 ENCOUNTER — Emergency Department (HOSPITAL_COMMUNITY)
Admission: EM | Admit: 2016-01-11 | Discharge: 2016-01-11 | Disposition: A | Discharge status: Other Acute Inpatient Hospital | Payer: Medicaid Other | Attending: Emergency Medicine | Admitting: Emergency Medicine

## 2016-01-11 ENCOUNTER — Encounter (HOSPITAL_COMMUNITY): Payer: Self-pay | Admitting: *Deleted

## 2016-01-11 DIAGNOSIS — R45851 Suicidal ideations: Secondary | ICD-10-CM | POA: Diagnosis not present

## 2016-01-11 DIAGNOSIS — G2581 Restless legs syndrome: Secondary | ICD-10-CM | POA: Diagnosis present

## 2016-01-11 DIAGNOSIS — F1123 Opioid dependence with withdrawal: Secondary | ICD-10-CM | POA: Diagnosis present

## 2016-01-11 DIAGNOSIS — Z5181 Encounter for therapeutic drug level monitoring: Secondary | ICD-10-CM | POA: Diagnosis not present

## 2016-01-11 DIAGNOSIS — F332 Major depressive disorder, recurrent severe without psychotic features: Principal | ICD-10-CM | POA: Diagnosis present

## 2016-01-11 DIAGNOSIS — F1994 Other psychoactive substance use, unspecified with psychoactive substance-induced mood disorder: Secondary | ICD-10-CM | POA: Diagnosis present

## 2016-01-11 DIAGNOSIS — F1110 Opioid abuse, uncomplicated: Secondary | ICD-10-CM | POA: Diagnosis not present

## 2016-01-11 DIAGNOSIS — F17200 Nicotine dependence, unspecified, uncomplicated: Secondary | ICD-10-CM | POA: Insufficient documentation

## 2016-01-11 DIAGNOSIS — G4700 Insomnia, unspecified: Secondary | ICD-10-CM | POA: Diagnosis present

## 2016-01-11 DIAGNOSIS — F1190 Opioid use, unspecified, uncomplicated: Secondary | ICD-10-CM | POA: Diagnosis present

## 2016-01-11 DIAGNOSIS — F1120 Opioid dependence, uncomplicated: Secondary | ICD-10-CM | POA: Diagnosis present

## 2016-01-11 DIAGNOSIS — Z818 Family history of other mental and behavioral disorders: Secondary | ICD-10-CM

## 2016-01-11 DIAGNOSIS — F4310 Post-traumatic stress disorder, unspecified: Secondary | ICD-10-CM | POA: Diagnosis present

## 2016-01-11 LAB — CBC WITH DIFFERENTIAL/PLATELET
Basophils Absolute: 0 10*3/uL (ref 0.0–0.1)
Basophils Relative: 0 %
EOS ABS: 0 10*3/uL (ref 0.0–0.7)
EOS PCT: 0 %
HCT: 39.2 % (ref 36.0–46.0)
Hemoglobin: 13.3 g/dL (ref 12.0–15.0)
LYMPHS ABS: 1.6 10*3/uL (ref 0.7–4.0)
LYMPHS PCT: 23 %
MCH: 29.3 pg (ref 26.0–34.0)
MCHC: 33.9 g/dL (ref 30.0–36.0)
MCV: 86.3 fL (ref 78.0–100.0)
MONOS PCT: 4 %
Monocytes Absolute: 0.3 10*3/uL (ref 0.1–1.0)
Neutro Abs: 4.9 10*3/uL (ref 1.7–7.7)
Neutrophils Relative %: 73 %
PLATELETS: 237 10*3/uL (ref 150–400)
RBC: 4.54 MIL/uL (ref 3.87–5.11)
RDW: 13.1 % (ref 11.5–15.5)
WBC: 6.8 10*3/uL (ref 4.0–10.5)

## 2016-01-11 LAB — RAPID URINE DRUG SCREEN, HOSP PERFORMED
Amphetamines: NOT DETECTED
Barbiturates: NOT DETECTED
Benzodiazepines: NOT DETECTED
Cocaine: POSITIVE — AB
OPIATES: POSITIVE — AB
Tetrahydrocannabinol: NOT DETECTED

## 2016-01-11 LAB — COMPREHENSIVE METABOLIC PANEL
ALT: 133 U/L — AB (ref 14–54)
AST: 95 U/L — AB (ref 15–41)
Albumin: 3.7 g/dL (ref 3.5–5.0)
Alkaline Phosphatase: 79 U/L (ref 38–126)
Anion gap: 9 (ref 5–15)
BUN: 16 mg/dL (ref 6–20)
CHLORIDE: 108 mmol/L (ref 101–111)
CO2: 21 mmol/L — ABNORMAL LOW (ref 22–32)
CREATININE: 0.74 mg/dL (ref 0.44–1.00)
Calcium: 9.3 mg/dL (ref 8.9–10.3)
GFR calc Af Amer: >60 mL/min (ref >60)
Glucose, Bld: 77 mg/dL (ref 65–99)
Potassium: 3.9 mmol/L (ref 3.5–5.1)
Sodium: 138 mmol/L (ref 135–145)
Total Bilirubin: 1 mg/dL (ref 0.3–1.2)
Total Protein: 7.9 g/dL (ref 6.5–8.1)

## 2016-01-11 LAB — ETHANOL

## 2016-01-11 MED ORDER — MAGNESIUM HYDROXIDE 400 MG/5ML PO SUSP: 30.0000 mL | Freq: Every day | ORAL | Status: DC | PRN

## 2016-01-11 MED ORDER — ZOLPIDEM TARTRATE 5 MG PO TABS: 5.0000 mg | ORAL_TABLET | Freq: Every evening | ORAL | Status: DC | PRN

## 2016-01-11 MED ORDER — ACETAMINOPHEN 325 MG PO TABS: 650.0000 mg | ORAL_TABLET | ORAL | Status: DC | PRN

## 2016-01-11 MED ORDER — NAPROXEN 500 MG PO TABS
500.0000 mg | ORAL_TABLET | Freq: Two times a day (BID) | ORAL | Status: DC | PRN
  Administered 2016-01-12 – 2016-01-14 (×4): 500 mg via ORAL
  Filled 2016-01-11 (×4): qty 1

## 2016-01-11 MED ORDER — IBUPROFEN 200 MG PO TABS
600.0000 mg | ORAL_TABLET | Freq: Three times a day (TID) | ORAL | Status: DC | PRN
  Administered 2016-01-11: 600 mg via ORAL
  Filled 2016-01-11: qty 1

## 2016-01-11 MED ORDER — ALUM & MAG HYDROXIDE-SIMETH 200-200-20 MG/5ML PO SUSP: 30.0000 mL | ORAL | Status: DC | PRN

## 2016-01-11 MED ORDER — CLONIDINE HCL 0.1 MG PO TABS
0.1000 mg | ORAL_TABLET | Freq: Every day | ORAL | Status: AC
  Administered 2016-01-16 – 2016-01-17 (×2): 0.1 mg via ORAL
  Filled 2016-01-11 (×2): qty 1

## 2016-01-11 MED ORDER — LORAZEPAM 1 MG PO TABS
1.0000 mg | ORAL_TABLET | Freq: Three times a day (TID) | ORAL | Status: DC | PRN
  Administered 2016-01-11: 1 mg via ORAL
  Filled 2016-01-11: qty 1

## 2016-01-11 MED ORDER — CLONIDINE HCL 0.1 MG PO TABS
0.1000 mg | ORAL_TABLET | Freq: Four times a day (QID) | ORAL | Status: DC
  Administered 2016-01-11: 0.1 mg via ORAL
  Filled 2016-01-11: qty 1

## 2016-01-11 MED ORDER — LOPERAMIDE HCL 2 MG PO CAPS: 2.0000 mg | ORAL_CAPSULE | ORAL | Status: AC | PRN

## 2016-01-11 MED ORDER — LOPERAMIDE HCL 2 MG PO CAPS
2.0000 mg | ORAL_CAPSULE | ORAL | Status: DC | PRN
  Administered 2016-01-11: 2 mg via ORAL
  Filled 2016-01-11: qty 1

## 2016-01-11 MED ORDER — NAPROXEN 250 MG PO TABS
500.0000 mg | ORAL_TABLET | Freq: Two times a day (BID) | ORAL | Status: DC | PRN
  Administered 2016-01-11: 500 mg via ORAL
  Filled 2016-01-11: qty 2

## 2016-01-11 MED ORDER — METHOCARBAMOL 500 MG PO TABS
500.0000 mg | ORAL_TABLET | Freq: Three times a day (TID) | ORAL | Status: AC | PRN
  Administered 2016-01-12 – 2016-01-16 (×7): 500 mg via ORAL
  Filled 2016-01-11 (×7): qty 1

## 2016-01-11 MED ORDER — ACETAMINOPHEN 325 MG PO TABS
650.0000 mg | ORAL_TABLET | Freq: Four times a day (QID) | ORAL | Status: DC | PRN
  Administered 2016-01-12 (×2): 650 mg via ORAL
  Filled 2016-01-11 (×2): qty 2

## 2016-01-11 MED ORDER — NICOTINE 21 MG/24HR TD PT24
21.0000 mg | MEDICATED_PATCH | Freq: Every day | TRANSDERMAL | Status: DC
  Filled 2016-01-11: qty 1

## 2016-01-11 MED ORDER — LORAZEPAM 1 MG PO TABS
1.0000 mg | ORAL_TABLET | Freq: Once | ORAL | Status: AC
  Administered 2016-01-11: 1 mg via ORAL
  Filled 2016-01-11: qty 1

## 2016-01-11 MED ORDER — ONDANSETRON 4 MG PO TBDP: 4.0000 mg | ORAL_TABLET | Freq: Four times a day (QID) | ORAL | Status: AC | PRN

## 2016-01-11 MED ORDER — CLONIDINE HCL 0.1 MG PO TABS
0.1000 mg | ORAL_TABLET | ORAL | Status: DC
Start: 2016-01-13 — End: 2016-01-11

## 2016-01-11 MED ORDER — CLONIDINE HCL 0.1 MG PO TABS
0.1000 mg | ORAL_TABLET | Freq: Four times a day (QID) | ORAL | Status: AC
  Administered 2016-01-11 – 2016-01-13 (×7): 0.1 mg via ORAL
  Filled 2016-01-11 (×9): qty 1

## 2016-01-11 MED ORDER — HYDROXYZINE HCL 25 MG PO TABS
25.0000 mg | ORAL_TABLET | Freq: Four times a day (QID) | ORAL | Status: AC | PRN
  Administered 2016-01-12 – 2016-01-15 (×4): 25 mg via ORAL
  Filled 2016-01-11 (×4): qty 1

## 2016-01-11 MED ORDER — CLONIDINE HCL 0.1 MG PO TABS
0.1000 mg | ORAL_TABLET | ORAL | Status: AC
  Administered 2016-01-14 – 2016-01-15 (×4): 0.1 mg via ORAL
  Filled 2016-01-11 (×4): qty 1

## 2016-01-11 MED ORDER — TRAZODONE HCL 50 MG PO TABS
50.0000 mg | ORAL_TABLET | Freq: Every evening | ORAL | Status: DC | PRN
Start: 2016-01-11 — End: 2016-01-17
  Administered 2016-01-11 – 2016-01-16 (×6): 50 mg via ORAL
  Filled 2016-01-11 (×14): qty 1

## 2016-01-11 MED ORDER — DICYCLOMINE HCL 20 MG PO TABS: 20.0000 mg | ORAL_TABLET | Freq: Four times a day (QID) | ORAL | Status: DC | PRN

## 2016-01-11 MED ORDER — ONDANSETRON HCL 4 MG PO TABS: 4.0000 mg | ORAL_TABLET | Freq: Three times a day (TID) | ORAL | Status: DC | PRN

## 2016-01-11 MED ORDER — ONDANSETRON 4 MG PO TBDP
4.0000 mg | ORAL_TABLET | Freq: Four times a day (QID) | ORAL | Status: DC | PRN
  Administered 2016-01-11: 4 mg via ORAL
  Filled 2016-01-11: qty 1

## 2016-01-11 MED ORDER — METHOCARBAMOL 500 MG PO TABS
500.0000 mg | ORAL_TABLET | Freq: Three times a day (TID) | ORAL | Status: DC | PRN
  Administered 2016-01-11: 500 mg via ORAL
  Filled 2016-01-11: qty 1

## 2016-01-11 MED ORDER — DICYCLOMINE HCL 20 MG PO TABS: 20.0000 mg | ORAL_TABLET | Freq: Four times a day (QID) | ORAL | Status: AC | PRN

## 2016-01-11 MED ORDER — CLONIDINE HCL 0.1 MG PO TABS: 0.1000 mg | ORAL_TABLET | Freq: Every day | ORAL | Status: DC

## 2016-01-11 MED ORDER — HYDROXYZINE HCL 25 MG PO TABS
25.0000 mg | ORAL_TABLET | Freq: Four times a day (QID) | ORAL | Status: DC | PRN
  Administered 2016-01-11: 25 mg via ORAL
  Filled 2016-01-11: qty 1
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
## 2016-01-11 NOTE — ED Notes (Signed)
Pt given afternoon snack. 

## 2016-01-11 NOTE — ED Notes (Signed)
Dr. Campos at bedside   

## 2016-01-11 NOTE — ED Notes (Signed)
Accepting provider at behavioral health will be Dr. Lucianne MussKumar.

## 2016-01-11 NOTE — ED Notes (Signed)
This RN spoke with Inetta Fermoina from behavioral health, she states that the pt has not been assigned a bed at this time but she will let us know when a bed has been assigned.

## 2016-01-11 NOTE — ED Notes (Signed)
Spoke with behavioral health, states that the pt meets observation criteria and that the pt will be able to go to behavioral health. States that the pt should have a bed placement in the next hour.

## 2016-01-11 NOTE — ED Notes (Signed)
Meal delivered and Behavioral rules (visitation, meals, snacks, phone calls, etc) explained to Pt and guest.

## 2016-01-11 NOTE — ED Provider Notes (Signed)
[facility]PT Provider Note   CSN: [id number] Arrival date & time: 01/11/16  1239  First Provider Contact:  First MD Initiated Contact with Patient 01/11/16 1317        History   Chief Complaint Chief Complaint  Patient presents with  . Suicidal    HPI Kathy Reed is a 30 y.o. female.  Patient presents today with a chief complaint of Suicidal Ideations.  She states that she uses IV Heroin daily.  Last Heroin use was yesterday. Today she started having restless legs, nausea, and suicidal thoughts.  When asked if she had a specific suicidal plan she states, "not yet."  She denies HI.  She denies fever, chills, vomiting, or any other symptoms at this time.  She states that she wishes to detox from the Heroin.      Past Medical History:  Diagnosis Date  . Dysplasia of cervix, low grade (CIN 1)   . Hepatitis C   . Pre-eclampsia     Patient Active Problem List   Diagnosis Date Noted  . Preeclampsia 09/14/2010  . Heart palpitations 09/14/2010    Past Surgical History:  Procedure Laterality Date  . C SECTION X2    . CESAREAN SECTION    . CESAREAN SECTION  04/02/2011   Procedure: CESAREAN SECTION;  Surgeon: Bing Plume, MD;  Location: [facility];  Service: Gynecology;  Laterality: N/A;  Repeat of baby boy at [facility] 9/9  . INCONTINENCE SURGERY    . KNEE SURGERY    . KNEE SURGERY    . TONSILLECTOMY      OB History    Gravida Para Term Preterm AB Living   SAB TAB Ectopic Multiple Live Births   2       1       Home Medications    Prior to Admission medications   Medication Sig Start Date End Date Taking? Authorizing Provider  meloxicam (MOBIC) 7.5 MG tablet Take 1 tablet (7.5 mg total) by mouth daily. 01/07/16   Trixie Dredge, PA-C    Family History History reviewed. No pertinent family history.  Social History Social History  Substance Use Topics  . Smoking status: Current Some Day Smoker  . Smokeless tobacco: Never Used  . Alcohol  use No     Allergies   Review of patient's allergies indicates no known allergies.   Review of Systems Review of Systems  All other systems reviewed and are negative.    Physical Exam Updated Vital Signs BP 116/84 (BP Location: Right Arm)   Pulse 88   Temp 98.6 F (37 C) (Oral)   Resp 16   Ht  (1.575 m)   Wt 49.9 kg   LMP  (LMP Unknown)   SpO2 100%   BMI 20.12 kg/m   Physical Exam  Constitutional: She appears well-developed and well-nourished.  HENT:  Head: Normocephalic and atraumatic.  Mouth/Throat: Oropharynx is clear and moist.  Neck: Normal range of motion. Neck supple.  Cardiovascular: Normal rate, regular rhythm and normal heart sounds.   Pulmonary/Chest: Effort normal and breath sounds normal.  Musculoskeletal:  Left wrist splint  Neurological: She is alert.  Skin: Skin is warm and dry.  Track marks to the RUE.  No abscess.  No erythema, edema, or warmth of the RUE.    Psychiatric: Her speech is normal. Her mood appears anxious.  Nursing note and vitals reviewed.    ED Treatments / Results  Labs (all labs ordered are listed, but only abnormal results are displayed) Labs Reviewed  COMPREHENSIVE METABOLIC PANEL  ETHANOL  CBC WITH DIFFERENTIAL/PLATELET  URINE RAPID DRUG SCREEN, HOSP PERFORMED    EKG  EKG Interpretation None       Radiology No results found.  Procedures Procedures (including critical care time)  Medications Ordered in ED Medications  dicyclomine (BENTYL) tablet 20 mg (not administered)  hydrOXYzine (ATARAX/VISTARIL) tablet 25 mg (25 mg Oral Given 01/11/16 1339)  loperamide (IMODIUM) capsule 2-4 mg (2 mg Oral Given 01/11/16 1339)  methocarbamol (ROBAXIN) tablet 500 mg (500 mg Oral Given 01/11/16 1339)  naproxen (NAPROSYN) tablet 500 mg (500 mg Oral Given 01/11/16 1339)  ondansetron (ZOFRAN-ODT) disintegrating tablet 4 mg (4 mg Oral Given 01/11/16 1337)  cloNIDine (CATAPRES) tablet 0.1 mg (not administered)    Followed by    cloNIDine (CATAPRES) tablet 0.1 mg (not administered)    Followed by  cloNIDine (CATAPRES) tablet 0.1 mg (not administered)     Initial Impression / Assessment and Plan / ED Course  I have reviewed the triage vital signs and the nursing notes.  Pertinent labs & imaging results that were available during my care of the patient were reviewed by me and considered in my medical decision making (see chart for details).  Clinical Course    Patient with a history of Heroin abuse presents today with SI and also requesting detox from Heroin.  She is exhibiting mild signs of withdrawal at this time.  Labs showing elevated LFT's, but appear to baseline.  Labs otherwise unremarkable.  Clonidine orders placed for withdrawal.  TTS consulted who recommended admission for obs.  Psych holding orders placed.  Final Clinical Impressions(s) / ED Diagnoses   Final diagnoses:  None    New Prescriptions New Prescriptions   No medications on file     Santiago GladHeather Dale Strausser, PA-C 01/11/16 1548    Santiago GladHeather Belal Scallon, PA-C 01/11/16 1620    Azalia BilisKevin Campos, MD 01/12/16 1524

## 2016-01-11 NOTE — ED Notes (Addendum)
Pt changed into wine scrubs. Zip ties applied to the cabinets in the room. Tape applied to all outlets.  Sitter at bedside.

## 2016-01-11 NOTE — ED Notes (Signed)
REcalled Kathy Reed

## 2016-01-11 NOTE — ED Notes (Addendum)
Consent for behavioral health observation obtained. Consent faxed to behavioral health.   Ortho tech at bedside

## 2016-01-11 NOTE — ED Notes (Signed)
Lunch meal ordered.

## 2016-01-11 NOTE — ED Notes (Signed)
While this RN was giving the pt her clonidine the pt asked how long it would take, this RN explained that we expect it to start working between 30 to 45 minutes, the pt then stated "I'm gonna commit suicide!" this RN explained that we were keeping her safe with the sitter and securing her environment. The pt denied a plan at this time.

## 2016-01-11 NOTE — ED Notes (Signed)
Pt eating dinner
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
## 2016-01-11 NOTE — ED Triage Notes (Addendum)
Pt presents with need for detox from injecting heroin.  Pt last used yesterday, reports suicidal ideation from overdosing.  Pt states "I need something for these withdrawals or I'm walking out".

## 2016-01-11 NOTE — Progress Notes (Signed)
Admission Note:  D: Patient is a 30 year old female came in voluntarily to obs. [facility]  Due to depression and suicide ideation with a plan to hang herself. On admission, patient reports using heroine daily and currently having some withdrawal symptoms such as body aches and sweating. Patient was irritable and blunt, unwilling to answer questions/talk to the writer. She stated "Just take me to sleep. get me some food, snacks and drinks". Patient appear drowsy and weak. Patient has a cast on left hand as a result of fall.  A: Skin was assessed, no contraband found. Tattoo noted right arm, bruises and a mole Left hip.POC and unit policies explained and understanding verbalized. Consents obtained. Accepted food and fluids offered.  R: Patient had no additional questions or concerns.
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
## 2016-01-11 NOTE — Progress Notes (Signed)
[facility] INPATIENT:  Family/Significant Other Suicide Prevention Education  Suicide Prevention Education:  Patient Refusal for Family/Significant Other Suicide Prevention Education: The patient Kathy Reed has refused to provide written consent for family/significant other to be provided Family/Significant Other Suicide Prevention Education during admission and/or prior to discharge.  Physician notified.  Glenice LaineIbekwe, Haidynn Almendarez B 01/11/2016, 9:35 PM

## 2016-01-11 NOTE — ED Notes (Signed)
[facility]

## 2016-01-11 NOTE — ED Notes (Signed)
Pt states "If you all don't give me something better for withdrawal I'm walking out this door". This RN explained to the pt her bed placement and that in order to keep her safe we could not let her leave at this time. Will inform provider.

## 2016-01-11 NOTE — ED Notes (Signed)
TTS cart at bedside. Pt has eaten lunch and security has wanded the pt.

## 2016-01-11 NOTE — Progress Notes (Signed)
Orthopedic Tech Progress Note Patient Details:  Kathy Reed 12/18/1985 [id number]  Ortho Devices Type of Ortho Device: Ace wrap, Sugartong splint Splint Material: Fiberglass Ortho Device/Splint Location: lue Ortho Device/Splint Interventions: Application   Kathy Reed 01/11/2016, 5:09 PM

## 2016-01-12 DIAGNOSIS — F332 Major depressive disorder, recurrent severe without psychotic features: Secondary | ICD-10-CM | POA: Diagnosis present

## 2016-01-12 DIAGNOSIS — G4700 Insomnia, unspecified: Secondary | ICD-10-CM | POA: Diagnosis present

## 2016-01-12 DIAGNOSIS — F1994 Other psychoactive substance use, unspecified with psychoactive substance-induced mood disorder: Secondary | ICD-10-CM | POA: Diagnosis not present

## 2016-01-12 DIAGNOSIS — F17200 Nicotine dependence, unspecified, uncomplicated: Secondary | ICD-10-CM | POA: Diagnosis present

## 2016-01-12 DIAGNOSIS — F4310 Post-traumatic stress disorder, unspecified: Secondary | ICD-10-CM | POA: Diagnosis present

## 2016-01-12 DIAGNOSIS — F1120 Opioid dependence, uncomplicated: Secondary | ICD-10-CM

## 2016-01-12 DIAGNOSIS — R45851 Suicidal ideations: Secondary | ICD-10-CM

## 2016-01-12 DIAGNOSIS — Z818 Family history of other mental and behavioral disorders: Secondary | ICD-10-CM | POA: Diagnosis not present

## 2016-01-12 DIAGNOSIS — G2581 Restless legs syndrome: Secondary | ICD-10-CM | POA: Diagnosis present

## 2016-01-12 DIAGNOSIS — F1123 Opioid dependence with withdrawal: Secondary | ICD-10-CM | POA: Diagnosis present
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
## 2016-01-12 NOTE — Progress Notes (Signed)
Nursing Note 01/12/2016 (admission time)-1930  Data Affect sullen mood depressed.  Reports passive SI, but says she can remain safe on unit, and will approach staff if this changes.  Denies HI and AVH.  C/O anxiety and severe generalized pain.  Denies other physical complaints. Laying in bed most of shift.  Action Spoke with patient 1:1, nurse offered support to patient throughout shift.  NP aware of pain- given PRN naproxen before arriving on unit.  Given PRN tylenol, robaxin, and vistaril by this nurse.  Continues to be monitored on 15 minute checks for safety.  Response Patient observed resting with eyes closed even non-labored chest rises in bed shortly after tylenol/robaxin/vistaril.   Remains safe and appropriate on unit.

## 2016-01-12 NOTE — Progress Notes (Signed)
Pt admitted to the adult unit form the OBS unit. Pt reports pain all over, requests to see the MD and have her bed adjusted. MD and assigned nurse notified. Pt is resting in her bed. Respirations are even and unlabored. Pt contracts with staff for safety. Safety maintained on the unit.
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
## 2016-01-12 NOTE — Progress Notes (Signed)
Patient ID: Kathy Reed, female   DOB: 02/11/1986, 30 y.o.   MRN: [id number] Patient transferred to adult. Report given to Jan RN.

## 2016-01-13 LAB — PREGNANCY, URINE: PREG TEST UR: NEGATIVE

## 2016-01-13 MED ORDER — BOOST / RESOURCE BREEZE PO LIQD
1.0000 | Freq: Three times a day (TID) | ORAL | Status: DC
  Administered 2016-01-13 – 2016-01-14 (×6): 1 via ORAL
  Administered 2016-01-15: 21:00:00 via ORAL
  Administered 2016-01-16: 1 via ORAL
  Filled 2016-01-13 (×16): qty 1

## 2016-01-13 NOTE — [facility] Group Notes (Signed)
[facility] LCSW Group Therapy 01/13/2016  1:15 PM   Type of Therapy: Group Therapy  Participation Level: Did Not Attend. Patient invited to participate but declined.   [name], MSW, LCSW Clinical Social Worker [facility] [phone]   

## 2016-01-13 NOTE — [facility] Group Notes (Signed)
[facility] LCSW Aftercare Discharge Planning Group Note  01/13/2016  8:45 AM  Participation Quality: Did Not Attend. Patient invited to participate but declined.  Keyion Knack, MSW, LCSW Clinical Social Worker [facility] [phone]   
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
## 2016-01-13 NOTE — Progress Notes (Signed)
NUTRITION ASSESSMENT  Pt identified as at risk on the Malnutrition Screen Tool  INTERVENTION: 1. Educated patient on the importance of nutrition and encouraged intake of food and beverages. 2. Discussed weight goals. 3. Supplements: will order Boost Breeze po TID, each supplement provides 250 kcal and 9 grams of protein  NUTRITION DIAGNOSIS: Unintentional weight loss related to sub-optimal intake as evidenced by pt report.   Goal: Pt to meet >/= 90% of their estimated nutrition needs.  Monitor:  PO intake  Assessment:  Pt admitted for SI and opiate use with withdrawal. Pt reports recent 20 lb weight loss; based on CBW this indicates 18% body weight in unknown time frame. Will order Boost Breeze TID. Continue to encourage PO intakes of meals and snacks.  30 y.o. female  Height: Ht Readings from Last 1 Encounters:  01/12/16 5\' 2"  (1.575 m)    Weight: Wt Readings from Last 1 Encounters:  01/12/16 90 lb (40.8 kg)    Weight Hx: Wt Readings from Last 10 Encounters:  01/12/16 90 lb (40.8 kg)  01/11/16 110 lb (49.9 kg)  04/02/15 114 lb (51.7 kg)  10/15/14 120 lb (54.4 kg)  07/10/14 115 lb (52.2 kg)  04/06/11 134 lb 14.4 oz (61.2 kg)  09/14/10 123 lb (55.8 kg)    BMI:  Body mass index is 16.46 kg/m. Pt meets criteria for underweight based on current BMI.  Estimated Nutritional Needs: Kcal: 25-30 kcal/kg Protein: > 1 gram protein/kg Fluid: 1 ml/kcal  Diet Order: Diet regular Room service appropriate? Yes; Fluid consistency: Thin Pt is also offered choice of unit snacks mid-morning and mid-afternoon.  Pt is eating as desired.   Lab results and medications reviewed.     Trenton GammonJessica Cassius Cullinane, MS, RD, LDN Inpatient Clinical Dietitian Pager # [phone] After hours/weekend pager # [phone]

## 2016-01-13 NOTE — Progress Notes (Signed)
Patient attended Karaoke, but did not participate.
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
## 2016-01-13 NOTE — Progress Notes (Signed)
Recreation Therapy Notes  Date: 01/13/16 Time: 930 Location: 300 Hall Group Room  Group Topic: Stress Management  Goal Area(s) Addresses:  Patient will verbalize importance of using healthy stress management.  Patient will identify positive emotions associated with healthy stress management.   Intervention: Guided Imagery Script  Activity :  Environmental managereaceful Place Script.  LRT introduced the technique of guided imagery to patients.  Patients were to follow along as LRT read script so they could participate in activity.  Education:  Stress Management, Discharge Planning.   Education Outcome: Needs additional education  Clinical Observations/Feedback: Pt did not attend group.    Caroll RancherMarjette Linna Thebeau, LRT/CTRS        Caroll RancherLindsay, Miette Molenda A 01/13/2016 12:25 PM

## 2016-01-13 NOTE — [facility] Group Notes (Signed)
Interdisciplinary Treatment Plan Update (Adult) Date: 01/13/2016    Time Reviewed: 9:30 AM  Progress in Treatment: Attending groups: Continuing to assess, patient new to milieu Participating in groups: Continuing to assess, patient new to milieu Taking medication as prescribed: Yes Tolerating medication: Yes Family/Significant other contact made: No, CSW assessing for appropriate contacts Patient understands diagnosis: Yes Discussing patient identified problems/goals with staff: Yes Medical problems stabilized or resolved: Yes Denies suicidal/homicidal ideation: Yes Issues/concerns per patient self-inventory: Yes Other:  New problem(s) identified: N/A  Discharge Plan or Barriers: CSW continuing to assess, patient new to milieu.  Reason for Continuation of Hospitalization:  Depression Anxiety Medication Stabilization   Comments: N/A  Estimated length of stay: 3-5 days   Patient is a 30 year old female who presented to the hospital with SI and plan to hang herself. Primary triggers for admission include daily heroin use and relationship stressors. Patient will benefit from crisis stabilization, medication evaluation, group therapy and psycho education in addition to case management for discharge planning. At discharge, it is recommended that Pt remain compliant with established discharge plan and continued treatment.   Review of initial/current patient goals per problem list:  1. Goal(s): Patient will participate in aftercare plan   Met: No   Target date: 3-5 days post admission date   As evidenced by: Patient will participate within aftercare plan AEB aftercare provider and housing plan at discharge being identified.  8/11: Goal not met: CSW assessing for appropriate referrals for pt and will have follow up secured prior to d/c.   2. Goal (s): Patient will exhibit decreased depressive symptoms and suicidal ideations.   Met: No   Target date: 3-5 days post  admission date   As evidenced by: Patient will utilize self rating of depression at 3 or below and demonstrate decreased signs of depression or be deemed stable for discharge by MD.  8/11: Goal not met: Pt presents with flat affect and depressed mood.  Pt admitted with high levels of depression. Pt to show decreased sign of depression and a rating of 3 or less before d/c.     3. Goal(s): Patient will demonstrate decreased signs and symptoms of anxiety.   Met: No   Target date: 3-5 days post admission date   As evidenced by: Patient will utilize self rating of anxiety at 3 or below and demonstrated decreased signs of anxiety, or be deemed stable for discharge by MD  8/11: Goal not met: Pt presents with anxious mood and affect.  Pt admitted with high anxiety levels. Pt to show decreased sign of anxiety and a rating of 3 or less before d/c.     4. Goal(s): Patient will demonstrate decreased signs of withdrawal due to substance abuse   Met: No   Target date: 3-5 days post admission date   As evidenced by: Patient will produce a CIWA/COWS score of 0, have stable vitals signs, and no symptoms of withdrawal  8/11: Patient with COWS score of 9, experiencing sweating, restlessness, aches, tremor, anxiety.     Attendees: Patient:   01/13/2016   Family:   01/13/2016   Physician:  Dr. Parke Poisson; Dr. Modesta Messing MD 01/13/2016   Nursing:   Otilio Carpen, Neita Carp, RN 01/13/2016   Clinical Social Worker:  01/13/2016   Clinical Social Worker: Erasmo Downer Lavi Sheehan LCSW; Peri Maris LCSW 01/13/2016   Other:   01/13/2016   Other:  Agustina Caroli, Samuel Jester, NP 01/13/2016    Scribe for Treatment Team:  Tilden Fossa,  LCSW [phone]

## 2016-01-13 NOTE — Progress Notes (Signed)
Patient ID: Kathy Reed, female   DOB: 05/19/1986, 30 y.o.   MRN: [id number] D: Client visible on the unit, reports depression and anxiety "10" of 10, "cause my arm messed up and I got to have major surgery" "It feel numb" "I think I move around to much today" A: Writer assessed arm, splint intact, fingers move freely, warm to touch, capillary refill <2seconds. Encouraged client to use sling, for restricted movement and ask staff for help as needed. Medications reviewed, administered as ordered. Staff will monitor q515min for safety. R: Client is safe on the unit, attended group.

## 2016-01-13 NOTE — Progress Notes (Signed)
Patient attended AA group this evening.  

## 2016-01-14 MED ORDER — TRAMADOL HCL 50 MG PO TABS: 50.0000 mg | ORAL_TABLET | Freq: Two times a day (BID) | ORAL | Status: DC | PRN

## 2016-01-14 MED ORDER — SERTRALINE HCL 25 MG PO TABS
25.0000 mg | ORAL_TABLET | Freq: Every day | ORAL | Status: DC
  Administered 2016-01-14 – 2016-01-17 (×4): 25 mg via ORAL
  Filled 2016-01-14 (×5): qty 1

## 2016-01-14 MED ORDER — DICLOFENAC SODIUM 75 MG PO TBEC
75.0000 mg | DELAYED_RELEASE_TABLET | Freq: Two times a day (BID) | ORAL | Status: DC
  Administered 2016-01-14 – 2016-01-17 (×7): 75 mg via ORAL
  Filled 2016-01-14 (×9): qty 1

## 2016-01-14 NOTE — Progress Notes (Signed)
Patient ID: Kathy Reed, female   DOB: 10/23/1985, 30 y.o.   MRN: [id number] D: Client in bed most of the shift complains of "feeling lightheaded and dizzy" "can you bring me my medicine" A: Writer provided emotional support, assessed client, VS taken (see Doc-Flow), fluids encouraged. Medication reviewed, administered as ordered. Splint on left arm intact, fingers move freely, warm, capillary refill >2 seconds. Staff will maintain 15min checks for safety. R: Client is safe on the unit.

## 2016-01-14 NOTE — [facility] Group Notes (Signed)
Adult Psychoeducational Group Note  Date:  01/14/2016 Time:  [id number]  Group Topic/Focus: The focus of today's group is to help the patient develop a coping skills tool box.  By the end of the session the goal is to complete a work sheet listing 3 strengths or abilities, 3 stressors, and 5 coping skills.  The goal of the discussion is to identify how to apply strengths, abilities, and coping skills to cope with stressors.  There is some discussion about the importance of managing symptoms with medications and therapy, but the focus will be on utilizing coping skills to increase tolerance of stress as stressors occur.   Participation Level:  Minimal  Participation Quality:  Appropriate  Affect:  Appropriate and Blunted  Cognitive:  Alert  Insight: Appropriate  Engagement in Group:  Limited  Modes of Intervention:  Discussion, Education, Orientation, Problem-solving and Rapport Building  Additional Comments:  Patient attended half of group but left early.  She shared minimally during the beginning of group and was appropriate.  She did not complete the worksheet.  Melton AlarDaniel P Kairi Harshbarger 01/14/2016, 2:09 PM
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
## 2016-01-14 NOTE — [facility] Group Notes (Signed)
[facility] Group Notes: (Clinical Social Work)   01/14/2016      Type of Therapy:  Group Therapy   Participation Level:  Did Not Attend despite MHT prompting   Ambrose MantleMareida Grossman-Orr, LCSW 01/14/2016, 12:30 PM

## 2016-01-14 NOTE — Progress Notes (Signed)
Patient complained of increased anxiety and depression this shift.  Patient denies SI, HI and AVH.  Patient reported pain in her arm due to her casting.   Assess patient for safety, offer medications as prescribed, engage patient in 1:1 staff talks.   Continue to monitor as prescribed engage patient in 1:1 staff talks.

## 2016-01-15 NOTE — [facility] Group Notes (Signed)
Patient did nto attend group.

## 2016-01-15 NOTE — [facility] Group Notes (Signed)
[facility] Group Notes: (Clinical Social Work)   01/15/2016      Type of Therapy:  Group Therapy   Participation Level:  Did Not Attend despite MHT prompting   Dixie Jafri Grossman-Orr, LCSW 01/15/2016, 12:55 PM     

## 2016-01-15 NOTE — Tx Team (Addendum)
Initial Interdisciplinary Treatment Plan   PATIENT STRESSORS: Health problems Marital or family conflict Substance abuse Traumatic event   PATIENT STRENGTHS: Average or above average intelligence Communication skills   PROBLEM LIST: Problem List/Patient Goals Date to be addressed Date deferred Reason deferred Estimated date of resolution  "try to keep a sober life"  01-14-16     Substance abuse 01-14-16     Fractured left arm 01-14-16     Depression 01-14-16     SI 01-14-16                              DISCHARGE CRITERIA:  Adequate post-discharge living arrangements Improved stabilization in mood, thinking, and/or behavior Need for constant or close observation no longer present Reduction of life-threatening or endangering symptoms to within safe limits Verbal commitment to aftercare and medication compliance  PRELIMINARY DISCHARGE PLAN: Attend aftercare/continuing care group Outpatient therapy Participate in family therapy  PATIENT/FAMIILY INVOLVEMENT: This treatment plan has been presented to and reviewed with the patient, Kathy Reed, and/or family member.  The patient and family have been given the opportunity to ask questions and make suggestions.  Kathy NeedyJohnson, Shaunette Gassner N 01/15/2016, 4:14 AM

## 2016-01-15 NOTE — Plan of Care (Signed)
Problem: Medication: Goal: Compliance with prescribed medication regimen will improve Outcome: Progressing Client will comply with medication regime AEB taking medications as prescribed, reporting benefits or any side effects.

## 2016-01-15 NOTE — Progress Notes (Signed)
D Pt. Denies SI and HI, complaints of leg cramps this pm.  A Writer offered support and encouragement, discussed coping skills with pt.  R Pt. Remains safe on the unit.   Rated her day a 10, her depression a 10, and her anxiety a 7.  Pt. States it is due to withdrawals.  Pt. Was difficult to assess due to just wanting to ask for medication.  Pt. Remains safe on the unit.

## 2016-01-15 NOTE — Progress Notes (Signed)
[facility]  01/15/2016 11:15 AM Kathy Reed  MRN:  [id number] Subjective:  Patient reports " I am feeling better than yesterday, Now that  I am sober I have a plan to get my children back from my mother."  Objective: Kathy Reed is awake, alert and oriented X4. Seen resting in bedroom.  Patient reports suicidal ideations due to pain. Patient is able to contract for safety, patient denies self injurious behaviors while on the unit. denies homicidal ideation. Patient appears bright and more attentive today. Patient reports her discharge plans and steps to get custody of her 3 children. Patient reports family is supportive. Patient reports her depression 7/10. States she is medication complaint and is tolerating medications well. Denies auditory or visual hallucination and does not appear to be responding to internal stimuli. Patient interacts well with staff and others. States poor appetite  and resting on and off. Patient report she is excited regarding discharge. Support, encouragement and reassurance was provided.   Principal Problem: MDD (major depressive disorder), recurrent severe, without psychosis (HCC) Diagnosis:   Patient Active Problem List   Diagnosis Date Noted  . MDD (major depressive disorder), recurrent severe, without psychosis (HCC) [F33.2] 01/12/2016  . Opiate dependence, continuous (HCC) [F11.20] 01/12/2016  . Substance induced mood disorder (HCC) [F19.94] 01/11/2016  . Preeclampsia [O14.90] 09/14/2010  . Heart palpitations [R00.2] 09/14/2010   Total Time spent with patient: 30 minutes  Past Psychiatric History: See Above  Past Medical History:  Past Medical History:  Diagnosis Date  . Dysplasia of cervix, low grade (CIN 1)   . Hepatitis C   . Pre-eclampsia     Past Surgical History:  Procedure Laterality Date  . C SECTION X2    . CESAREAN SECTION    . CESAREAN SECTION  04/02/2011   Procedure: CESAREAN SECTION;  Surgeon: Bing Plumehomas F Henley, MD;   Location: [facility];  Service: Gynecology;  Laterality: N/A;  Repeat of baby boy at [id number] 9/9  . INCONTINENCE SURGERY    . KNEE SURGERY    . KNEE SURGERY    . TONSILLECTOMY     Family History: History reviewed. No pertinent family history. Family Psychiatric  History: See H&P Social History:  History  Alcohol Use No     History  Drug Use No    Social History   Social History  . Marital status: Divorced    Spouse name: N/A  . Number of children: N/A  . Years of education: N/A   Social History Main Topics  . Smoking status: Current Some Day Smoker  . Smokeless tobacco: Never Used  . Alcohol use No  . Drug use: No  . Sexual activity: Yes   Other Topics Concern  . None   Social History Narrative  . None   Additional Social History:                         Sleep: Fair-improving  Appetite:  Fair  Current Medications: Current Facility-Administered Medications  Medication Dose Route Frequency Provider Last Rate Last Dose  . acetaminophen (TYLENOL) tablet 650 mg  650 mg Oral Q6H PRN Kerry HoughSpencer E Simon, PA-C   650 mg at 01/12/16 1949  . alum & mag hydroxide-simeth (MAALOX/MYLANTA) 200-200-20 MG/5ML suspension 30 mL  30 mL Oral Q4H PRN Kerry HoughSpencer E Simon, PA-C      . cloNIDine (CATAPRES) tablet 0.1 mg  0.1 mg Oral BH-qamhs Spencer E Simon, PA-C   0.1 mg at  01/15/16 0738   Followed by  . [START ON 01/16/2016] cloNIDine (CATAPRES) tablet 0.1 mg  0.1 mg Oral QAC breakfast Kerry Hough, PA-C      . diclofenac (VOLTAREN) EC tablet 75 mg  75 mg Oral BID Oneta Rack, NP   75 mg at 01/15/16 0739  . dicyclomine (BENTYL) tablet 20 mg  20 mg Oral Q6H PRN Kerry Hough, PA-C      . feeding supplement (BOOST / RESOURCE BREEZE) liquid 1 Container  1 Container Oral TID BM Craige Cotta, MD   1 Container at 01/14/16 2052  . hydrOXYzine (ATARAX/VISTARIL) tablet 25 mg  25 mg Oral Q6H PRN Kerry Hough, PA-C   25 mg at 01/13/16 1409  . loperamide (IMODIUM) capsule 2-4 mg   2-4 mg Oral PRN Kerry Hough, PA-C      . magnesium hydroxide (MILK OF MAGNESIA) suspension 30 mL  30 mL Oral Daily PRN Kerry Hough, PA-C      . methocarbamol (ROBAXIN) tablet 500 mg  500 mg Oral Q8H PRN Kerry Hough, PA-C   500 mg at 01/13/16 1703  . ondansetron (ZOFRAN-ODT) disintegrating tablet 4 mg  4 mg Oral Q6H PRN Kerry Hough, PA-C      . sertraline (ZOLOFT) tablet 25 mg  25 mg Oral Daily Oneta Rack, NP   25 mg at 01/15/16 0738  . traZODone (DESYREL) tablet 50 mg  50 mg Oral QHS,MR X 1 Kerry Hough, PA-C   50 mg at 01/14/16 2122    Lab Results:  Results for orders placed or performed during the hospital encounter of 01/11/16 (from the past 48 hour(s))  Pregnancy, urine     Status: None   Collection Time: 01/13/16  6:08 PM  Result Value Ref Range   Preg Test, Ur NEGATIVE NEGATIVE    Comment:        THE SENSITIVITY OF THIS METHODOLOGY IS >20 mIU/mL. Performed at [facility]     Blood Alcohol level:  Lab Results  Component Value Date   [facility] <5 01/11/2016    Metabolic Disorder Labs: No results found for: HGBA1C, MPG No results found for: PROLACTIN No results found for: CHOL, TRIG, HDL, CHOLHDL, VLDL, LDLCALC  Physical Findings: AIMS:  , ,  ,  ,    CIWA:  CIWA-Ar Total: 1 COWS:  COWS Total Score: 0  Musculoskeletal: Strength & Muscle Tone: within normal limits Gait & Station: normal Patient leans: N/A  Psychiatric Specialty Exam: Physical Exam  Nursing note and vitals reviewed. Constitutional: She is oriented to person, place, and time.  HENT:  Head: Normocephalic.  Musculoskeletal: Normal range of motion.  Neurological: She is alert and oriented to person, place, and time.  Psychiatric: She has a normal mood and affect. Her behavior is normal.    Review of Systems  Musculoskeletal:       Left arm possible fracture/break. Pending ortho consult on 01/16/2016   Psychiatric/Behavioral: Positive for depression. Negative for  hallucinations and suicidal ideas. The patient is nervous/anxious and has insomnia.     Blood pressure 121/76, pulse 94, temperature 98.4 F (36.9 C), temperature source Oral, resp. rate 16, height  (1.575 m), weight 40.8 kg (90 lb), SpO2 100 %.Body mass index is 16.46 kg/m.  General Appearance: Casual  Eye Contact:  Fair  Speech:  Clear and Coherent  Volume:  Normal  Mood:  Anxious and Depressed  Affect:  Congruent  Thought Process:  Coherent  Orientation:  Full (Time, Place, and Person)  Thought Content:  Rumination with pain medication and ortho consult scheduled for Monday  Suicidal Thoughts:  Yes.  without intent/plan  Homicidal Thoughts:  No  Memory:  Immediate;   Fair Recent;   Fair Remote;   Fair  Judgement:  Good  Insight:  Fair  Psychomotor Activity:  Restlessness  Concentration:  Concentration: Fair  Recall:  [employer] of Knowledge:  Fair  Language:  Fair  Akathisia:  No  Handed:  Right  AIMS (if indicated):     Assets:  Communication Skills Desire for Improvement Physical Health Resilience  ADL's:  Intact  Cognition:  WNL  Sleep:  Number of Hours: 6.75     I agree with current treatment plan on 01/15/2016, Patient seen face-to-face for psychiatric evaluation follow-up, chart reviewed and case discussed with the MD Michae Kava, Advanced Practice Provider and Treatment team. Reviewed the information documented and agree with the treatment plan.  Treatment Plan Summary: Daily contact with patient to assess and evaluate symptoms and progress in treatment and Medication management   Per MD Noted: Opiate detox protocol with Clonidine -  Have recommended antidepressant treatment  such as Remeron, which may help improve sleep, appetite, as well as treat depression - patient is not opened to medications for depression at this time. Patient is ruminative regarding pain medications.  Start Voltaren 75 mg PO BID for pain. Continue Opiate detox protocol with  Clonidine Continue with Trazodone 50 mg for insomnia Will continue to monitor vitals ,medication compliance and treatment side effects while patient is here.  Reviewed labs: elevated ,BAL - 0, UDS - pos for cocaine and opiates CSW will start working on disposition.  Patient to participate in therapeutic milieu  Oneta Rack, NP 01/15/2016, 11:15 AM
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
## 2016-01-16 NOTE — Progress Notes (Signed)
Recreation Therapy Notes  Date: 01/16/16 Time: 0930 Location: 300 Group Room  Group Topic: Stress Management  Goal Area(s) Addresses:  Patient will verbalize importance of using healthy stress management.  Patient will identify positive emotions associated with healthy stress management.   Intervention: Stress Management  Activity :  [place] Script.  LRT will introduce the technique of guided imagery to patients.  Patients will follow along with LRT as script is read to engage in activity.  Education:  Stress Management, Discharge Planning.   Education Outcome: Needs additional education  Clinical Observations/Feedback: Pt did not attend group.    Tommaso Cavitt, LRT/CTRS  

## 2016-01-16 NOTE — [facility] Group Notes (Signed)
Patient did not attend group.

## 2016-01-16 NOTE — Progress Notes (Signed)
Patient signed a 72 hour discharge on 01/16/16 at 1253 hrs. Physician was made aware
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
## 2016-01-16 NOTE — Progress Notes (Signed)
Patient ID: Kathy Reed, female   DOB: 01/30/1986, 30 y.o.   MRN: [id number]  Pt currently presents with a flat affect and depressed behavior. Pt reports to writer that their goal is to "I want to go home, I am going to live with my parents and kids." Pt states "please bring me my medications, I don't feel good and I am depressed." Pt reports poor sleep with current medication regimen. Pt remains in bed for the entire shift. Pt mood labile.   Pt provided with medications per providers orders. Pt's labs and vitals were monitored throughout the night. Pt supported emotionally and encouraged to express concerns and questions. Pt educated on medications. Pt encouraged to attend group.   Pt's safety ensured with 15 minute and environmental checks. Pt currently denies SI/HI and A/V hallucinations. Pt verbally agrees to seek staff if SI/HI or A/VH occurs and to consult with staff before acting on any harmful thoughts. Pt given a supplemental drink and knocks it over on the floor when drinking it. Will continue POC.

## 2016-01-16 NOTE — [facility] Group Notes (Signed)
[facility] LCSW Group Therapy 01/16/2016  1:15 PM   Type of Therapy: Group Therapy  Participation Level: Did Not Attend. Patient invited to participate but declined.   Shahzad Thomann, MSW, LCSW Clinical Social Worker [facility] [phone]   

## 2016-01-16 NOTE — Progress Notes (Signed)
Patient ID: Kathy Reed, female   DOB: 07/29/1985, 30 y.o.   MRN: [id number]  Charge Nurse Note- Agricultural consultantWriter and Administrative Coordinator spoke with patient 1:1 about patient's reported discomfort with a female patient that had allegedly came into her room a couple nights ago. Staff pulled patient to the side due to patient seen laughing and shaking hands with the patient she has alleged came into her room and made her uncomfortable. Staff felt the needed to intervene in case patient was in an uncomfortable position. When A/C asked Marchelle Folksmanda if she was uncomfortable patient stated yes that a peer on the hall had told her that another female on the hall did not like her. This was a statement on made by the patient and not anyone else on the hall. She did not speak about the discomfort with the female peer on the hall until the A/C asked her specifically. She stated she felt okay. This female peer discharged today. Patient voiced no other complaints or concerns about the female peer on the hall and no confrontations were observed between patient and female peer. MD Aquilla HackerHizada was notified as well as patient's RN Britney T. At this time MD Hizada does not recommend patient to be moved to a different hall but observe patient and other female peer closely in order to keep unit and milieu safe.

## 2016-01-16 NOTE — Progress Notes (Signed)
D: Patient denies SI/HI and A/V hallucinations; patient complains of restless legs and Dr. Vanetta ShawlHisada; patient also wants to go home   A: Monitored q 15 minutes; patient encouraged to attend groups; patient educated about medications; patient given medications per physician orders; patient encouraged to express feelings and/or concerns  R: Patient is is engaging with peers; laughing and joking with the peers; patient at times isolates in her room; patient's interaction with staff and peers; patient was able to set goal to talk with staff 1:1 when having feelings of SI; patient is taking medications as prescribed and tolerating medications
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
## 2016-01-17 MED ORDER — TRAZODONE HCL 50 MG PO TABS: 50.0000 mg | ORAL_TABLET | Freq: Every evening | ORAL | 0 refills | Status: AC | PRN

## 2016-01-17 MED ORDER — SERTRALINE HCL 25 MG PO TABS: 25.0000 mg | ORAL_TABLET | Freq: Every day | ORAL | 0 refills | Status: AC

## 2016-01-17 MED ORDER — DICLOFENAC SODIUM 75 MG PO TBEC: 75.0000 mg | DELAYED_RELEASE_TABLET | Freq: Two times a day (BID) | ORAL | 0 refills | Status: AC
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
## 2016-01-17 NOTE — Progress Notes (Signed)
  [facility] Adult [facility] Management Discharge Plan :  Will you be returning to the same living situation after discharge:  No, patient plans to stay with her mother At discharge, do you have transportation home?: Yes,  bus pass provided Do you have the ability to pay for your medications: Yes,  patient provided with prescriptions at discharge  Release of information consent forms completed and in the chart;  Patient's signature needed at discharge.  Patient to Follow up at: Follow-up Information    [facility] Follow up on 01/18/2016.   Specialty:  Behavioral Health Why:  Go on this day or any other day Monday-Friday at 8am for assessment for therapy and medication management services.  Contact information: [address] [place] [address] [phone]           Next level of care provider has [facility] to [facility] Link:no  Safety Planning and Suicide Prevention discussed: Yes,  with patient  Have you used any form of tobacco in the last 30 days? (Cigarettes, Smokeless Tobacco, Cigars, and/or Pipes): No  Has patient been referred to the Quitline?: N/A patient is not a smoker  Patient has been referred for addiction treatment: Yes  Kathy CarboKristin L Tymel Reed 01/17/2016, 9:48 AM
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
## 2016-01-17 NOTE — [facility] Suicide Risk Assessment (Signed)
[facility] INPATIENT:  Family/Significant Other Suicide Prevention Education  Suicide Prevention Education:  Patient Refusal for Family/Significant Other Suicide Prevention Education: The patient Kathy Reed has refused to provide written consent for family/significant other to be provided Family/Significant Other Suicide Prevention Education during admission and/or prior to discharge.  Physician notified. SPE reviewed with patient and brochure provided. Patient encouraged to return to hospital if having suicidal thoughts, patient verbalized his/her understanding and has no further questions at this time.   Buelah Rennie L Reanna Scoggin 01/17/2016, 9:48 AM

## 2016-01-17 NOTE — Progress Notes (Signed)
Patient appropriate, polite.  Attended group this AM- fixated on discharge.  Patient stated she has opportunity to follow up with orthopedics today and had plan to live with mother.  Spoke with treatment team regarding this plan.  Received discharge orders.  Reviewed medications, discharge instructions, and follow up appointments with patient .  Patient denied SI, HI, and AVH.  Agrees to contact someone or 911 if she has thoughts/intent to harm self or others.    Bus pass and scripts handed to patient.  Paperwork, AVS, SRA, and transition record handed to patient.   Escorted off of unit at 1134. Belongings returned per belongings form.  Discharged to lobby.  To follow up per AVS.

## 2016-01-20 ENCOUNTER — Encounter (HOSPITAL_COMMUNITY): Payer: Self-pay | Admitting: Emergency Medicine

## 2016-01-20 DIAGNOSIS — F17200 Nicotine dependence, unspecified, uncomplicated: Secondary | ICD-10-CM | POA: Insufficient documentation

## 2016-01-20 DIAGNOSIS — R202 Paresthesia of skin: Secondary | ICD-10-CM | POA: Diagnosis not present

## 2016-01-20 DIAGNOSIS — Z79899 Other long term (current) drug therapy: Secondary | ICD-10-CM | POA: Diagnosis not present

## 2016-01-20 LAB — CBC WITH DIFFERENTIAL/PLATELET
BASOS ABS: 0 10*3/uL (ref 0.0–0.1)
BASOS PCT: 0 %
EOS ABS: 0.2 10*3/uL (ref 0.0–0.7)
EOS PCT: 3 %
HEMATOCRIT: 35.6 % — AB (ref 36.0–46.0)
Hemoglobin: 11.2 g/dL — ABNORMAL LOW (ref 12.0–15.0)
Lymphocytes Relative: 19 %
Lymphs Abs: 1.2 10*3/uL (ref 0.7–4.0)
MCH: 28.6 pg (ref 26.0–34.0)
MCHC: 31.5 g/dL (ref 30.0–36.0)
MCV: 90.8 fL (ref 78.0–100.0)
MONO ABS: 0.5 10*3/uL (ref 0.1–1.0)
MONOS PCT: 9 %
NEUTROS ABS: 4.2 10*3/uL (ref 1.7–7.7)
Neutrophils Relative %: 69 %
PLATELETS: 233 10*3/uL (ref 150–400)
RBC: 3.92 MIL/uL (ref 3.87–5.11)
RDW: 13.7 % (ref 11.5–15.5)
WBC: 6 10*3/uL (ref 4.0–10.5)

## 2016-01-20 LAB — COMPREHENSIVE METABOLIC PANEL
ALBUMIN: 3.3 g/dL — AB (ref 3.5–5.0)
ALT: 96 U/L — ABNORMAL HIGH (ref 14–54)
ANION GAP: 7 (ref 5–15)
AST: 83 U/L — AB (ref 15–41)
Alkaline Phosphatase: 81 U/L (ref 38–126)
BILIRUBIN TOTAL: 0.4 mg/dL (ref 0.3–1.2)
BUN: 11 mg/dL (ref 6–20)
CHLORIDE: 104 mmol/L (ref 101–111)
CO2: 27 mmol/L (ref 22–32)
Calcium: 8.6 mg/dL — ABNORMAL LOW (ref 8.9–10.3)
Creatinine, Ser: 0.62 mg/dL (ref 0.44–1.00)
GFR calc Af Amer: >60 mL/min (ref >60)
GFR calc non Af Amer: >60 mL/min (ref >60)
GLUCOSE: 126 mg/dL — AB (ref 65–99)
POTASSIUM: 3.4 mmol/L — AB (ref 3.5–5.1)
SODIUM: 138 mmol/L (ref 135–145)
TOTAL PROTEIN: 6.6 g/dL (ref 6.5–8.1)

## 2016-01-20 LAB — POC URINE PREG, ED: Preg Test, Ur: NEGATIVE

## 2016-01-20 NOTE — ED Triage Notes (Addendum)
Pt. reports right fingers tingling and right arm numbness'stiffness onset yesterday , denies injury or fall . Pt. is able to hold dollar bills at right hand at triage . Denies fever or chills , respirations unlabored .

## 2016-01-21 ENCOUNTER — Emergency Department (HOSPITAL_COMMUNITY)
Admission: EM | Admit: 2016-01-21 | Discharge: 2016-01-21 | Disposition: A | Discharge status: Home / Self Care | Payer: Medicaid Other | Attending: Emergency Medicine | Admitting: Emergency Medicine

## 2016-01-21 ENCOUNTER — Emergency Department (HOSPITAL_COMMUNITY): Payer: Medicaid Other

## 2016-01-21 DIAGNOSIS — R202 Paresthesia of skin: Secondary | ICD-10-CM

## 2016-01-21 LAB — URINE MICROSCOPIC-ADD ON

## 2016-01-21 LAB — URINALYSIS, ROUTINE W REFLEX MICROSCOPIC
GLUCOSE, UA: NEGATIVE mg/dL
Ketones, ur: NEGATIVE mg/dL
Nitrite: NEGATIVE
PH: 5.5 (ref 5.0–8.0)
Protein, ur: NEGATIVE mg/dL
SPECIFIC GRAVITY, URINE: 1.023 (ref 1.005–1.030)

## 2016-01-21 LAB — RAPID URINE DRUG SCREEN, HOSP PERFORMED
Amphetamines: NOT DETECTED
Barbiturates: NOT DETECTED
Benzodiazepines: NOT DETECTED
Cocaine: NOT DETECTED
Opiates: NOT DETECTED
Tetrahydrocannabinol: NOT DETECTED

## 2016-01-21 MED ORDER — SULFAMETHOXAZOLE-TRIMETHOPRIM 800-160 MG PO TABS
1.0000 | ORAL_TABLET | Freq: Once | ORAL | Status: AC
  Administered 2016-01-21: 1 via ORAL
  Filled 2016-01-21: qty 1

## 2016-01-21 MED ORDER — SODIUM CHLORIDE 0.9 % IV BOLUS (SEPSIS)
1000.0000 mL | Freq: Once | INTRAVENOUS | Status: AC
  Administered 2016-01-21: 1000 mL via INTRAVENOUS

## 2016-01-21 MED ORDER — METRONIDAZOLE 500 MG PO TABS
2000.0000 mg | ORAL_TABLET | Freq: Once | ORAL | Status: AC
  Administered 2016-01-21: 2000 mg via ORAL
  Filled 2016-01-21: qty 4

## 2016-01-21 MED ORDER — GADOBENATE DIMEGLUMINE 529 MG/ML IV SOLN
10.0000 mL | Freq: Once | INTRAVENOUS | Status: AC | PRN
  Administered 2016-01-21: 9 mL via INTRAVENOUS

## 2016-01-21 MED ORDER — IBUPROFEN 200 MG PO TABS
600.0000 mg | ORAL_TABLET | Freq: Once | ORAL | Status: AC
  Administered 2016-01-21: 600 mg via ORAL
  Filled 2016-01-21: qty 3

## 2016-01-21 MED ORDER — LORAZEPAM 2 MG/ML IJ SOLN
1.0000 mg | Freq: Once | INTRAMUSCULAR | Status: AC
  Administered 2016-01-21: 1 mg via INTRAVENOUS
  Filled 2016-01-21: qty 1
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
## 2016-01-21 NOTE — ED Notes (Signed)
Gave pt additional sandwich "for the road". Pt departed in NAD.

## 2016-01-21 NOTE — ED Notes (Signed)
Patient transported to X-ray 

## 2016-01-21 NOTE — ED Notes (Addendum)
Per MD orders, Ativan not to be given until just before transport to MRI.

## 2016-01-21 NOTE — ED Notes (Signed)
Patient transported to MRI 

## 2016-01-27 ENCOUNTER — Encounter (HOSPITAL_COMMUNITY): Payer: Self-pay | Admitting: *Deleted

## 2016-01-27 MED ORDER — CEFAZOLIN SODIUM-DEXTROSE 2-4 GM/100ML-% IV SOLN: 2.0000 g | INTRAVENOUS | Status: AC

## 2016-01-28 NOTE — H&P (Signed)
Kathy Reed is an 30 y.o. female.   Chief Complaint: left forearm fracture HPI: Pt here for surgery Pt sustained closed left ulnar shaft fracture No prior surgery to left arm  Past Medical History:  Diagnosis Date  . Arthritis    back  . Chondromalacia of both patellae   . Depression    denies- 01/26/17  . Drug abuse    HX Cocaine, [place]- [facility]  . Dysplasia of cervix, low grade (CIN 1)   . Hepatitis C   . Pre-eclampsia   . Psychosis    denies- 01/27/16    Past Surgical History:  Procedure Laterality Date  . C SECTION X2    . CESAREAN SECTION    . CESAREAN SECTION  04/02/2011   Procedure: CESAREAN SECTION;  Surgeon: Bing Plumehomas F Henley, MD;  Location: [facility];  Service: Gynecology;  Laterality: N/A;  [facility] boy at [facility] 9/9  . INCONTINENCE SURGERY     Bladder Sling  . KNEE SURGERY Bilateral 2000,2001   for Patella Chondromalacia  . KNEE SURGERY    . TONSILLECTOMY      No family history on file. Social History:  reports that she has never smoked. She has never used smokeless tobacco. She reports that she uses drugs. She reports that she does not drink alcohol.  Allergies:  Allergies  Allergen Reactions  . No Known Allergies     No prescriptions prior to admission.    No results found for this or any previous visit (from the past 48 hour(s)). No results found.  ROS NO RECENT ILLNESSES OR HOSPITALIZATIONS  Last menstrual period 01/24/2016. Physical Exam  General Appearance:  Alert, cooperative, no distress, appears stated age  Head:  Normocephalic, without obvious abnormality, atraumatic  Eyes:  Pupils equal, conjunctiva/corneas clear,         Throat: Lips, mucosa, and tongue normal; teeth and gums normal  Neck: No visible masses     Lungs:   respirations unlabored  Chest Wall:  No tenderness or deformity  Heart:  Regular rate and rhythm,  Abdomen:   Soft, non-tender,         Extremities: LEFT FOREARM: SKIN  INTACT, MILD DEFORMITY TO LEFT FOREARM ABLE TO CROSS FINGERS FINGERS WARM WELL PERFUSED  Pulses: 2+ and symmetric  Skin: Skin color, texture, turgor normal, no rashes or lesions     Neurologic: Normal    Assessment/Plan LEFT FOREARM ULNAR SHAFT FRACTURE  LEFT FOREARM OPEN REDUCTION AND INTERNAL FIXATION AND REPAIR AS INDICATED  R/B/A DISCUSSED WITH PT IN OFFICE.  PT VOICED UNDERSTANDING OF PLAN CONSENT SIGNED DAY OF SURGERY PT SEEN AND EXAMINED PRIOR TO OPERATIVE PROCEDURE/DAY OF SURGERY SITE MARKED. QUESTIONS ANSWERED WILL GO HOME FOLLOWING SURGERY  WE ARE PLANNING SURGERY FOR YOUR UPPER EXTREMITY. THE RISKS AND BENEFITS OF SURGERY INCLUDE BUT NOT LIMITED TO BLEEDING INFECTION, DAMAGE TO NEARBY NERVES ARTERIES TENDONS, FAILURE OF SURGERY TO ACCOMPLISH ITS INTENDED GOALS, PERSISTENT SYMPTOMS AND NEED FOR FURTHER SURGICAL INTERVENTION. WITH THIS IN MIND WE WILL PROCEED. I HAVE DISCUSSED WITH THE PATIENT THE PRE AND POSTOPERATIVE REGIMEN AND THE DOS AND DON'TS. PT VOICED UNDERSTANDING AND INFORMED CONSENT SIGNED. Kathy CovertORTMANN,Kathy Reed 02/01/2016 @ 1556

## 2016-01-28 NOTE — Brief Op Note (Signed)
01/30/2016  10:30 AM  PATIENT:  Kathy HarkAmanda Reed  30 y.o. female  PRE-OPERATIVE DIAGNOSIS:  LEFT ULNA SHAFT FRACTURE  POST-OPERATIVE DIAGNOSIS:  * No post-op diagnosis entered *  PROCEDURE:  Procedure(s): OPEN REDUCTION INTERNAL FIXATION (ORIF)  LEFT ULNAR FRACTURE (Left)  SURGEON:  Surgeon(s) and Role:    * Bradly BienenstockFred Tabathia Knoche, MD - Primary  PHYSICIAN ASSISTANT:   ASSISTANTSSu Hilt: [facility]   ANESTHESIA:   general  EBL:  No intake/output data recorded.  BLOOD ADMINISTERED:none  DRAINS: none   LOCAL MEDICATIONS USED:  NONE  SPECIMEN:  No Specimen  DISPOSITION OF SPECIMEN:  N/A  COUNTS:  YES  TOURNIQUET:  * No tourniquets in log *  DICTATION: .Other Dictation: Dictation Number [id number]  PLAN OF CARE: Discharge to home after PACU  PATIENT DISPOSITION:  PACU - hemodynamically stable.   Delay start of Pharmacological VTE agent (>24hrs) due to surgical blood loss or risk of bleeding: not applicable

## 2016-01-31 ENCOUNTER — Encounter (HOSPITAL_COMMUNITY): Payer: Self-pay | Admitting: *Deleted

## 2016-01-31 NOTE — Progress Notes (Signed)
Anesthesia Chart Review: SAME DAY WORK-UP.  Patient is a 30 year old female scheduled for ORIF, left ulnar fracture on 02/01/16 by Dr. Melvyn Novasrtmann.  History includes non-smoker, drug abuse (detox 01/11/16-01/17/16); history of opiate dependence, heroin, cocaine), hepatitis C, depression (denied), arthritis, cervical dysplasia. PCP is listed as Dr. Tracey Harriesavid Bouska.  Meds currently listed include Zoloft, trazodone, diclofenac.  04/02/15 CXR: IMPRESSION: Stable chest x-ray. No evidence of acute cardiopulmonary abnormality. No pneumonia seen.  Labs from 01/20/16 noted. K 3.4, Cr 0.62. Glucose 126. AST 83, ALT 96 (consistent with prior results; known hepatitis C). H/H 11.2/35.6. PLT 233. Negative urine pregnancy test on 01/20/16.   Further evaluation by her anesthesiologist on the day of surgery to ensure no acute changes and to discuss the definitive plan.  [name], PA-C [facility]hesiology Phone [phone] 01/31/2016 12:56 PM

## 2016-01-31 NOTE — Progress Notes (Signed)
Pt denies SOB, chest pain, and being under the care of a cardiologist. Pt denies having a stress test, echo and cardiac cath. Pt denies having an EKG within the last year. Pt made aware to stop taking  Aspirin,vitamins, fish oil and herbal medications. Do not take any NSAIDs ie: Ibuprofen, Advil, Naproxen, BC and Goody Powder or any medication containing Aspirin. Pt verbalized understanding of all pre-op instructions. 

## 2016-02-01 ENCOUNTER — Ambulatory Visit (HOSPITAL_COMMUNITY)
Admission: RE | Admit: 2016-02-01 | Discharge: 2016-02-01 | Disposition: A | Discharge status: Home / Self Care | Payer: Medicaid Other | Source: Ambulatory Visit | Attending: Orthopedic Surgery | Admitting: Orthopedic Surgery

## 2016-02-01 ENCOUNTER — Encounter (HOSPITAL_COMMUNITY)
Admission: RE | Disposition: A | Discharge status: Home / Self Care | Payer: Self-pay | Source: Ambulatory Visit | Attending: Orthopedic Surgery

## 2016-02-01 ENCOUNTER — Encounter (HOSPITAL_COMMUNITY): Payer: Self-pay | Admitting: *Deleted

## 2016-02-01 ENCOUNTER — Ambulatory Visit (HOSPITAL_COMMUNITY): Payer: Medicaid Other | Admitting: Anesthesiology

## 2016-02-01 DIAGNOSIS — B1920 Unspecified viral hepatitis C without hepatic coma: Secondary | ICD-10-CM | POA: Insufficient documentation

## 2016-02-01 DIAGNOSIS — I10 Essential (primary) hypertension: Secondary | ICD-10-CM | POA: Insufficient documentation

## 2016-02-01 DIAGNOSIS — F329 Major depressive disorder, single episode, unspecified: Secondary | ICD-10-CM | POA: Insufficient documentation

## 2016-02-01 DIAGNOSIS — Z79891 Long term (current) use of opiate analgesic: Secondary | ICD-10-CM | POA: Diagnosis not present

## 2016-02-01 DIAGNOSIS — S52202A Unspecified fracture of shaft of left ulna, initial encounter for closed fracture: Secondary | ICD-10-CM | POA: Diagnosis present

## 2016-02-01 DIAGNOSIS — F1490 Cocaine use, unspecified, uncomplicated: Secondary | ICD-10-CM | POA: Diagnosis not present

## 2016-02-01 DIAGNOSIS — X58XXXA Exposure to other specified factors, initial encounter: Secondary | ICD-10-CM | POA: Diagnosis not present

## 2016-02-01 DIAGNOSIS — M1990 Unspecified osteoarthritis, unspecified site: Secondary | ICD-10-CM | POA: Diagnosis not present

## 2016-02-01 HISTORY — DX: Chondromalacia patellae, left knee: M22.42

## 2016-02-01 HISTORY — DX: Chondromalacia patellae, right knee: M22.41

## 2016-02-01 HISTORY — DX: Unspecified psychosis not due to a substance or known physiological condition: F29

## 2016-02-01 HISTORY — DX: Major depressive disorder, single episode, unspecified: F32.9

## 2016-02-01 HISTORY — DX: Depression, unspecified: F32.A

## 2016-02-01 HISTORY — PX: ORIF ULNAR FRACTURE: SHX5417

## 2016-02-01 HISTORY — DX: Unspecified fracture of shaft of unspecified ulna, initial encounter for closed fracture: S52.209A

## 2016-02-01 HISTORY — DX: Unspecified osteoarthritis, unspecified site: M19.90

## 2016-02-01 HISTORY — DX: Other psychoactive substance abuse, uncomplicated: F19.10

## 2016-02-01 LAB — RAPID URINE DRUG SCREEN, HOSP PERFORMED
AMPHETAMINES: NOT DETECTED
BARBITURATES: NOT DETECTED
BENZODIAZEPINES: NOT DETECTED
COCAINE: POSITIVE — AB
Opiates: POSITIVE — AB
Tetrahydrocannabinol: NOT DETECTED

## 2016-02-01 SURGERY — OPEN REDUCTION INTERNAL FIXATION (ORIF) ULNAR FRACTURE
Anesthesia: General | Laterality: Left

## 2016-02-01 MED ORDER — FENTANYL CITRATE (PF) 100 MCG/2ML IJ SOLN
INTRAMUSCULAR | Status: AC
  Filled 2016-02-01: qty 2

## 2016-02-01 MED ORDER — FENTANYL CITRATE (PF) 100 MCG/2ML IJ SOLN
INTRAMUSCULAR | Status: AC
  Administered 2016-02-01: 100 ug via INTRAVENOUS
  Filled 2016-02-01: qty 2

## 2016-02-01 MED ORDER — HYDROMORPHONE HCL 1 MG/ML IJ SOLN
0.2500 mg | INTRAMUSCULAR | Status: DC | PRN
  Administered 2016-02-01 (×2): 0.5 mg via INTRAVENOUS

## 2016-02-01 MED ORDER — PROMETHAZINE HCL 25 MG/ML IJ SOLN: 6.2500 mg | INTRAMUSCULAR | Status: DC | PRN

## 2016-02-01 MED ORDER — PROPOFOL 10 MG/ML IV BOLUS
INTRAVENOUS | Status: AC
  Filled 2016-02-01: qty 20

## 2016-02-01 MED ORDER — MIDAZOLAM HCL 2 MG/2ML IJ SOLN
INTRAMUSCULAR | Status: AC
  Filled 2016-02-01: qty 2

## 2016-02-01 MED ORDER — HYDROMORPHONE HCL 1 MG/ML IJ SOLN
INTRAMUSCULAR | Status: AC
  Filled 2016-02-01: qty 1

## 2016-02-01 MED ORDER — CHLORHEXIDINE GLUCONATE 4 % EX LIQD: 60.0000 mL | Freq: Once | CUTANEOUS | Status: DC

## 2016-02-01 MED ORDER — 0.9 % SODIUM CHLORIDE (POUR BTL) OPTIME
TOPICAL | Status: DC | PRN
  Administered 2016-02-01: 1000 mL

## 2016-02-01 MED ORDER — BUPIVACAINE HCL (PF) 0.5 % IJ SOLN
INTRAMUSCULAR | Status: DC | PRN
  Administered 2016-02-01: 20 mL via PERINEURAL

## 2016-02-01 MED ORDER — PHENYLEPHRINE HCL 10 MG/ML IJ SOLN
INTRAMUSCULAR | Status: DC | PRN
  Administered 2016-02-01: 80 ug via INTRAVENOUS

## 2016-02-01 MED ORDER — MIDAZOLAM HCL 2 MG/2ML IJ SOLN
INTRAMUSCULAR | Status: AC
  Administered 2016-02-01: 2 mg via INTRAVENOUS
  Filled 2016-02-01: qty 2

## 2016-02-01 MED ORDER — DEXAMETHASONE SODIUM PHOSPHATE 10 MG/ML IJ SOLN
INTRAMUSCULAR | Status: AC
  Filled 2016-02-01: qty 1

## 2016-02-01 MED ORDER — LIDOCAINE HCL (PF) 2 % IJ SOLN
INTRAMUSCULAR | Status: DC | PRN
  Administered 2016-02-01: 10 mL via PERINEURAL

## 2016-02-01 MED ORDER — HYDROMORPHONE HCL 1 MG/ML IJ SOLN
INTRAMUSCULAR | Status: AC
  Administered 2016-02-01: 0.5 mg via INTRAVENOUS
  Filled 2016-02-01: qty 1

## 2016-02-01 MED ORDER — FENTANYL CITRATE (PF) 100 MCG/2ML IJ SOLN
50.0000 ug | INTRAMUSCULAR | Status: DC | PRN
  Administered 2016-02-01: 100 ug via INTRAVENOUS

## 2016-02-01 MED ORDER — MIDAZOLAM HCL 2 MG/2ML IJ SOLN
INTRAMUSCULAR | Status: AC
  Administered 2016-02-01: 1 mg via INTRAVENOUS
  Filled 2016-02-01: qty 2

## 2016-02-01 MED ORDER — LIDOCAINE HCL (CARDIAC) 20 MG/ML IV SOLN
INTRAVENOUS | Status: DC | PRN
  Administered 2016-02-01: 80 mg via INTRAVENOUS

## 2016-02-01 MED ORDER — PHENYLEPHRINE 40 MCG/ML (10ML) SYRINGE FOR IV PUSH (FOR BLOOD PRESSURE SUPPORT)
PREFILLED_SYRINGE | INTRAVENOUS | Status: AC
  Filled 2016-02-01: qty 10

## 2016-02-01 MED ORDER — ONDANSETRON HCL 4 MG/2ML IJ SOLN
INTRAMUSCULAR | Status: DC | PRN
  Administered 2016-02-01: 4 mg via INTRAVENOUS

## 2016-02-01 MED ORDER — CEFAZOLIN SODIUM-DEXTROSE 2-4 GM/100ML-% IV SOLN
INTRAVENOUS | Status: DC
Start: 2016-02-01 — End: 2016-02-02
  Filled 2016-02-01: qty 100

## 2016-02-01 MED ORDER — MIDAZOLAM HCL 2 MG/2ML IJ SOLN
1.0000 mg | INTRAMUSCULAR | Status: DC | PRN
  Administered 2016-02-01: 2 mg via INTRAVENOUS
  Administered 2016-02-01: 1 mg via INTRAVENOUS

## 2016-02-01 MED ORDER — DEXAMETHASONE SODIUM PHOSPHATE 10 MG/ML IJ SOLN
INTRAMUSCULAR | Status: DC | PRN
  Administered 2016-02-01: 10 mg via INTRAVENOUS

## 2016-02-01 MED ORDER — KETOROLAC TROMETHAMINE 30 MG/ML IJ SOLN
INTRAMUSCULAR | Status: AC
  Administered 2016-02-01: 30 mg via INTRAVENOUS
  Filled 2016-02-01: qty 1

## 2016-02-01 MED ORDER — HYDROCODONE-ACETAMINOPHEN 5-300 MG PO TABS: 1.0000 | ORAL_TABLET | Freq: Two times a day (BID) | ORAL | 0 refills | Status: AC | PRN

## 2016-02-01 MED ORDER — PROPOFOL 10 MG/ML IV BOLUS
INTRAVENOUS | Status: DC | PRN
  Administered 2016-02-01: 200 mg via INTRAVENOUS

## 2016-02-01 MED ORDER — ONDANSETRON HCL 4 MG/2ML IJ SOLN
INTRAMUSCULAR | Status: AC
  Filled 2016-02-01: qty 2

## 2016-02-01 MED ORDER — PHENYLEPHRINE HCL 10 MG/ML IJ SOLN
INTRAVENOUS | Status: DC | PRN
  Administered 2016-02-01: 20 ug/min via INTRAVENOUS

## 2016-02-01 MED ORDER — KETOROLAC TROMETHAMINE 30 MG/ML IJ SOLN
30.0000 mg | Freq: Once | INTRAMUSCULAR | Status: AC | PRN
  Administered 2016-02-01: 30 mg via INTRAVENOUS

## 2016-02-01 MED ORDER — LACTATED RINGERS IV SOLN
INTRAVENOUS | Status: DC | PRN
  Administered 2016-02-01 (×2): via INTRAVENOUS

## 2016-02-01 SURGICAL SUPPLY — 69 items
BANDAGE ACE 4X5 VEL STRL LF (GAUZE/BANDAGES/DRESSINGS) ×3 IMPLANT
BANDAGE ELASTIC 3 VELCRO ST LF (GAUZE/BANDAGES/DRESSINGS) ×3 IMPLANT
BANDAGE ELASTIC 4 VELCRO ST LF (GAUZE/BANDAGES/DRESSINGS) ×3 IMPLANT
BIT DRILL Q-C 2.0 DIA 100 (BIT) ×2 IMPLANT
BIT DRILL Q-C 2.0MM DIA 100MM (BIT) ×1
BIT DRILL STD 2.0MM (DRILL) ×1 IMPLANT
BLADE SURG ROTATE 9660 (MISCELLANEOUS) IMPLANT
BNDG CMPR 9X4 STRL LF SNTH (GAUZE/BANDAGES/DRESSINGS) ×1
BNDG ESMARK 4X9 LF (GAUZE/BANDAGES/DRESSINGS) ×3 IMPLANT
BNDG GAUZE ELAST 4 BULKY (GAUZE/BANDAGES/DRESSINGS) ×3 IMPLANT
CLOSURE WOUND 1/2 X4 (GAUZE/BANDAGES/DRESSINGS)
CORDS BIPOLAR (ELECTRODE) ×6 IMPLANT
COVER SURGICAL LIGHT HANDLE (MISCELLANEOUS) ×6 IMPLANT
CUFF TOURNIQUET SINGLE 18IN (TOURNIQUET CUFF) ×3 IMPLANT
CUFF TOURNIQUET SINGLE 24IN (TOURNIQUET CUFF) IMPLANT
DRAIN TLS ROUND 10FR (DRAIN) IMPLANT
DRAPE OEC MINIVIEW 54X84 (DRAPES) ×3 IMPLANT
DRAPE SURG 17X11 SM STRL (DRAPES) ×3 IMPLANT
DRILL STANDARD 2.0MM (DRILL) ×3
DRSG ADAPTIC 3X8 NADH LF (GAUZE/BANDAGES/DRESSINGS) ×3 IMPLANT
ELECT REM PT RETURN 9FT ADLT (ELECTROSURGICAL)
ELECTRODE REM PT RTRN 9FT ADLT (ELECTROSURGICAL) IMPLANT
GAUZE SPONGE 4X4 12PLY STRL (GAUZE/BANDAGES/DRESSINGS) ×3 IMPLANT
GAUZE SPONGE 4X4 16PLY XRAY LF (GAUZE/BANDAGES/DRESSINGS) ×3 IMPLANT
GLOVE BIOGEL PI IND STRL 8.5 (GLOVE) ×1 IMPLANT
GLOVE BIOGEL PI INDICATOR 8.5 (GLOVE) ×2
GLOVE SURG ORTHO 8.0 STRL STRW (GLOVE) ×3 IMPLANT
GOWN STRL REUS W/ TWL LRG LVL3 (GOWN DISPOSABLE) ×3 IMPLANT
GOWN STRL REUS W/ TWL XL LVL3 (GOWN DISPOSABLE) ×1 IMPLANT
GOWN STRL REUS W/TWL LRG LVL3 (GOWN DISPOSABLE) ×9
GOWN STRL REUS W/TWL XL LVL3 (GOWN DISPOSABLE) ×3
KIT BASIN OR (CUSTOM PROCEDURE TRAY) ×3 IMPLANT
KIT ROOM TURNOVER OR (KITS) ×3 IMPLANT
MANIFOLD NEPTUNE II (INSTRUMENTS) IMPLANT
NEEDLE HYPO 25X1 1.5 SAFETY (NEEDLE) ×3 IMPLANT
NS IRRIG 1000ML POUR BTL (IV SOLUTION) ×3 IMPLANT
PACK ORTHO EXTREMITY (CUSTOM PROCEDURE TRAY) ×3 IMPLANT
PAD ARMBOARD 7.5X6 YLW CONV (MISCELLANEOUS) ×6 IMPLANT
PAD CAST 4YDX4 CTTN HI CHSV (CAST SUPPLIES) ×1 IMPLANT
PADDING CAST ABS 3INX4YD NS (CAST SUPPLIES) ×2
PADDING CAST ABS COTTON 3X4 (CAST SUPPLIES) ×1 IMPLANT
PADDING CAST COTTON 4X4 STRL (CAST SUPPLIES) ×2
PLATE COMP DUAL 7HOLE 2.7 (Plate) ×3 IMPLANT
PUTTY DBM STAGRAFT PLUS 2CC (Putty) ×3 IMPLANT
SCREW 2.7X16MM (Screw) ×3 IMPLANT
SCREW CORTICAL 2.7X14MM (Screw) ×12 IMPLANT
SCREW LOCK 14X2.7X NS (Screw) ×2 IMPLANT
SCREW LOCKING 2.7X12 (Screw) ×3 IMPLANT
SCREW LOCKING 2.7X14 (Screw) ×6 IMPLANT
SCREW NLOCK CORT 2.7X16 NS (Screw) ×1 IMPLANT
SLING ARM IMMOBILIZER LRG (SOFTGOODS) ×3 IMPLANT
SOAP 2 % CHG 4 OZ (WOUND CARE) ×3 IMPLANT
SPLINT FIBERGLASS 3X35 (CAST SUPPLIES) ×3 IMPLANT
SPONGE GAUZE 4X4 12PLY STER LF (GAUZE/BANDAGES/DRESSINGS) ×3 IMPLANT
SPONGE LAP 4X18 X RAY DECT (DISPOSABLE) ×3 IMPLANT
STRIP CLOSURE SKIN 1/2X4 (GAUZE/BANDAGES/DRESSINGS) IMPLANT
SUCTION FRAZIER HANDLE 10FR (MISCELLANEOUS) ×2
SUCTION TUBE FRAZIER 10FR DISP (MISCELLANEOUS) ×1 IMPLANT
SUT MNCRL AB 3-0 PS2 18 (SUTURE) ×3 IMPLANT
SUT VIC AB 2-0 FS1 27 (SUTURE) ×3 IMPLANT
SUT VICRYL RAPIDE 4/0 PS 2 (SUTURE) ×3 IMPLANT
SYR CONTROL 10ML LL (SYRINGE) IMPLANT
SYSTEM CHEST DRAIN TLS 7FR (DRAIN) IMPLANT
TOWEL OR 17X24 6PK STRL BLUE (TOWEL DISPOSABLE) ×6 IMPLANT
TOWEL OR 17X26 10 PK STRL BLUE (TOWEL DISPOSABLE) ×3 IMPLANT
TUBE CONNECTING 12'X1/4 (SUCTIONS) ×1
TUBE CONNECTING 12X1/4 (SUCTIONS) ×2 IMPLANT
WATER STERILE IRR 1000ML POUR (IV SOLUTION) ×3 IMPLANT
YANKAUER SUCT BULB TIP NO VENT (SUCTIONS) ×3 IMPLANT

## 2016-02-01 NOTE — OR Nursing (Signed)
Discharge instructions reviewed with patient and Marge Duncansfiancee James.  Questions answered.  Pt laughing and joking with fiancee.  Pt stated her arm was numb and she couldn't feel her arm.  I informed her that she had a regional nerve block and she may have numbness and pain control til sometime tomorrow.  I instructed her as she regains feeling and starts having pain, she should take her prescription.  Fayrene FearingJames stated they did not have transportation home.  Social work notified and bus passes received.  [place].  Fayrene FearingJames stated they came to the hospital on the bus.

## 2016-02-01 NOTE — Discharge Instructions (Signed)
KEEP BANDAGE CLEAN AND DRY °CALL OFFICE FOR F/U APPT [phone] in 16 days °KEEP HAND ELEVATED ABOVE HEART °OK TO APPLY ICE TO OPERATIVE AREA °CONTACT OFFICE IF ANY WORSENING PAIN OR CONCERNS. °

## 2016-02-01 NOTE — Anesthesia Preprocedure Evaluation (Signed)
Anesthesia Evaluation  Patient identified by MRN, date of birth, ID band Patient awake    Reviewed: Allergy & Precautions, NPO status , Patient's Chart, lab work & pertinent test results  Airway Mallampati: II  TM Distance: >3 FB Neck ROM: Full    Dental no notable dental hx.    Pulmonary neg pulmonary ROS,    Pulmonary exam normal breath sounds clear to auscultation       Cardiovascular hypertension, negative cardio ROS Normal cardiovascular exam Rhythm:Regular Rate:Normal     Neuro/Psych negative neurological ROS  negative psych ROS   GI/Hepatic negative GI ROS, (+)     substance abuse  cocaine use and IV drug use, Hepatitis -, C  Endo/Other  negative endocrine ROS  Renal/GU negative Renal ROS  negative genitourinary   Musculoskeletal negative musculoskeletal ROS (+)   Abdominal   Peds negative pediatric ROS (+)  Hematology negative hematology ROS (+)   Anesthesia Other Findings   Reproductive/Obstetrics negative OB ROS                             Anesthesia Physical Anesthesia Plan  ASA: II  Anesthesia Plan: General   Post-op Pain Management: GA combined w/ Regional for post-op pain   Induction: Intravenous  Airway Management Planned: LMA  Additional Equipment:   Intra-op Plan:   Post-operative Plan: Extubation in OR  Informed Consent: I have reviewed the patients History and Physical, chart, labs and discussed the procedure including the risks, benefits and alternatives for the proposed anesthesia with the patient or authorized representative who has indicated his/her understanding and acceptance.   Dental advisory given  Plan Discussed with: CRNA and Surgeon  Anesthesia Plan Comments:         Anesthesia Quick Evaluation

## 2016-02-01 NOTE — Transfer of Care (Signed)
Immediate Anesthesia Transfer of Care Note  Patient: Kathy Reed  Procedure(s) Performed: Procedure(s): OPEN REDUCTION INTERNAL FIXATION (ORIF)  LEFT ULNAR FRACTURE (Left)  Patient Location: PACU  Anesthesia Type:GA combined with regional for post-op pain  Level of Consciousness: sedated  Airway & Oxygen Therapy: Patient Spontanous Breathing and Patient connected to nasal cannula oxygen  Post-op Assessment: Report given to RN and Post -op Vital signs reviewed and stable  Post vital signs: Reviewed and stable  Last Vitals:  Vitals:   02/01/16 1640 02/01/16 1915  BP: (!) 119/98 124/80  Pulse: 73 74  Resp: 11 15  Temp:  36.2 C    Last Pain:  Vitals:   02/01/16 1915  TempSrc:   PainSc: 0-No pain      Patients Stated Pain Goal: 2 (02/01/16 1335)  Complications: No apparent anesthesia complications

## 2016-02-01 NOTE — Anesthesia Postprocedure Evaluation (Signed)
Anesthesia Post Note  Patient: Murlean Harkmanda Mantovani  Procedure(s) Performed: Procedure(s) (LRB): OPEN REDUCTION INTERNAL FIXATION (ORIF)  LEFT ULNAR FRACTURE (Left)  Patient location during evaluation: PACU Anesthesia Type: General Level of consciousness: awake and alert Pain management: pain level controlled Vital Signs Assessment: post-procedure vital signs reviewed and stable Respiratory status: spontaneous breathing, nonlabored ventilation, respiratory function stable and patient connected to nasal cannula oxygen Cardiovascular status: blood pressure returned to baseline and stable Postop Assessment: no signs of nausea or vomiting Anesthetic complications: no    Last Vitals:  Vitals:   02/01/16 2000 02/01/16 2011  BP: 125/88   Pulse: 71 72  Resp: 13 12  Temp:      Last Pain:  Vitals:   02/01/16 2011  TempSrc:   PainSc: Asleep                 Tye Juarez DAVID

## 2016-02-01 NOTE — Anesthesia Procedure Notes (Signed)
Procedure Name: LMA Insertion Date/Time: 02/01/2016 5:07 PM Performed by: Jefm MilesENNIE, Ermin Parisien E Pre-anesthesia Checklist: Patient identified, Emergency Drugs available, Suction available, Patient being monitored and Timeout performed Patient Re-evaluated:Patient Re-evaluated prior to inductionOxygen Delivery Method: Circle system utilized Preoxygenation: Pre-oxygenation with 100% oxygen Intubation Type: Combination inhalational/ intravenous induction Ventilation: Mask ventilation without difficulty LMA: LMA inserted LMA Size: 3.0 Number of attempts: 1 Placement Confirmation: positive ETCO2 and breath sounds checked- equal and bilateral Tube secured with: Tape Dental Injury: Teeth and Oropharynx as per pre-operative assessment

## 2016-02-01 NOTE — Anesthesia Procedure Notes (Signed)
Anesthesia Regional Block:  Supraclavicular block  Pre-Anesthetic Checklist: ,, timeout performed, Correct Patient, Correct Site, Correct Laterality, Correct Procedure, Correct Position, site marked, Risks and benefits discussed,  Surgical consent,  Pre-op evaluation,  At surgeon's request and post-op pain management  Laterality: Left  Prep: chloraprep       Needles:  Injection technique: Single-shot  Needle Type: Echogenic Needle     Needle Length: 9cm 9 cm Needle Gauge: 21 G    Additional Needles:  Procedures: ultrasound guided (picture in chart) Supraclavicular block Narrative:  Injection made incrementally with aspirations every 5 mL.  Performed by: Personally  Anesthesiologist: Staceyann Knouff  Additional Notes: Patient tolerated the procedure well without complications      

## 2016-02-01 NOTE — OR Nursing (Signed)
Patient and Kathy Reed escorted out of the main entrance.  Kathy Reed stated "We've got it".

## 2016-02-02 ENCOUNTER — Encounter (HOSPITAL_COMMUNITY): Payer: Self-pay | Admitting: Orthopedic Surgery
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
## 2016-02-11 LAB — OXYCODONES,MS,WB/SP RFX
OXYCOCONE: NEGATIVE ng/mL
OXYMORPHONE: NEGATIVE ng/mL
Oxycodones Confirmation: NEGATIVE

## 2016-02-11 LAB — OPIATES,MS,WB/SP RFX
6-Acetylmorphine: NEGATIVE
CODEINE: 1.6 ng/mL
Dihydrocodeine: NEGATIVE ng/mL
Hydrocodone: NEGATIVE ng/mL
Hydromorphone: NEGATIVE ng/mL
Morphine: 24.2 ng/mL
OPIATE CONFIRMATION: POSITIVE

## 2016-02-23 LAB — COCAINE,MS,WB/SP RFX
BENZOYLECGONINE: 125 ng/mL
Cocaine Confirmation: POSITIVE
Cocaine: NEGATIVE ng/mL

## 2016-02-23 LAB — DRUG SCREEN 10 W/CONF, SERUM
Amphetamines, IA: NEGATIVE ng/mL
BARBITURATES, IA: NEGATIVE ug/mL
Benzodiazepines, IA: NEGATIVE ng/mL
COCAINE & METABOLITE, IA: POSITIVE ng/mL
Methadone, IA: NEGATIVE ng/mL
Opiates, IA: POSITIVE ng/mL
Oxycodones, IA: NEGATIVE ng/mL
PHENCYCLIDINE, IA: NEGATIVE ng/mL
PROPOXYPHENE, IA: NEGATIVE ng/mL
THC(Marijuana) Metabolite, IA: NEGATIVE ng/mL

## 2016-10-09 IMAGING — DX DG SHOULDER 2+V*R*
3 series · 3 of 3 positions shown · non-contrast
Comparison: None.

CLINICAL DATA: Right shoulder pain for 2 days

EXAM:
RIGHT SHOULDER - 2+ VIEW

[shoulder grashey]
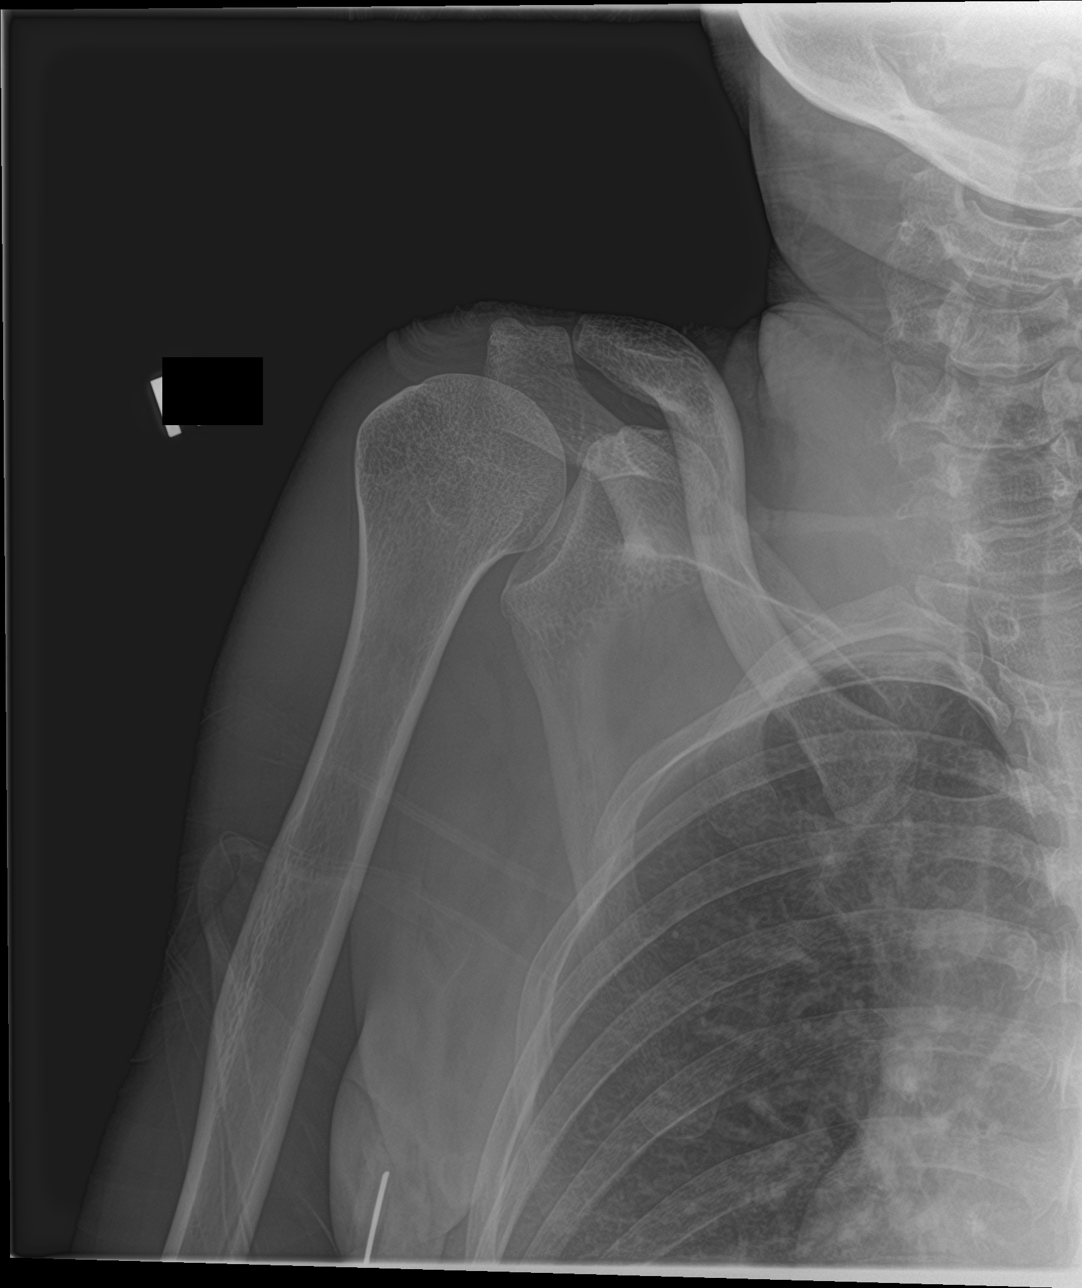

[shoulder y view]
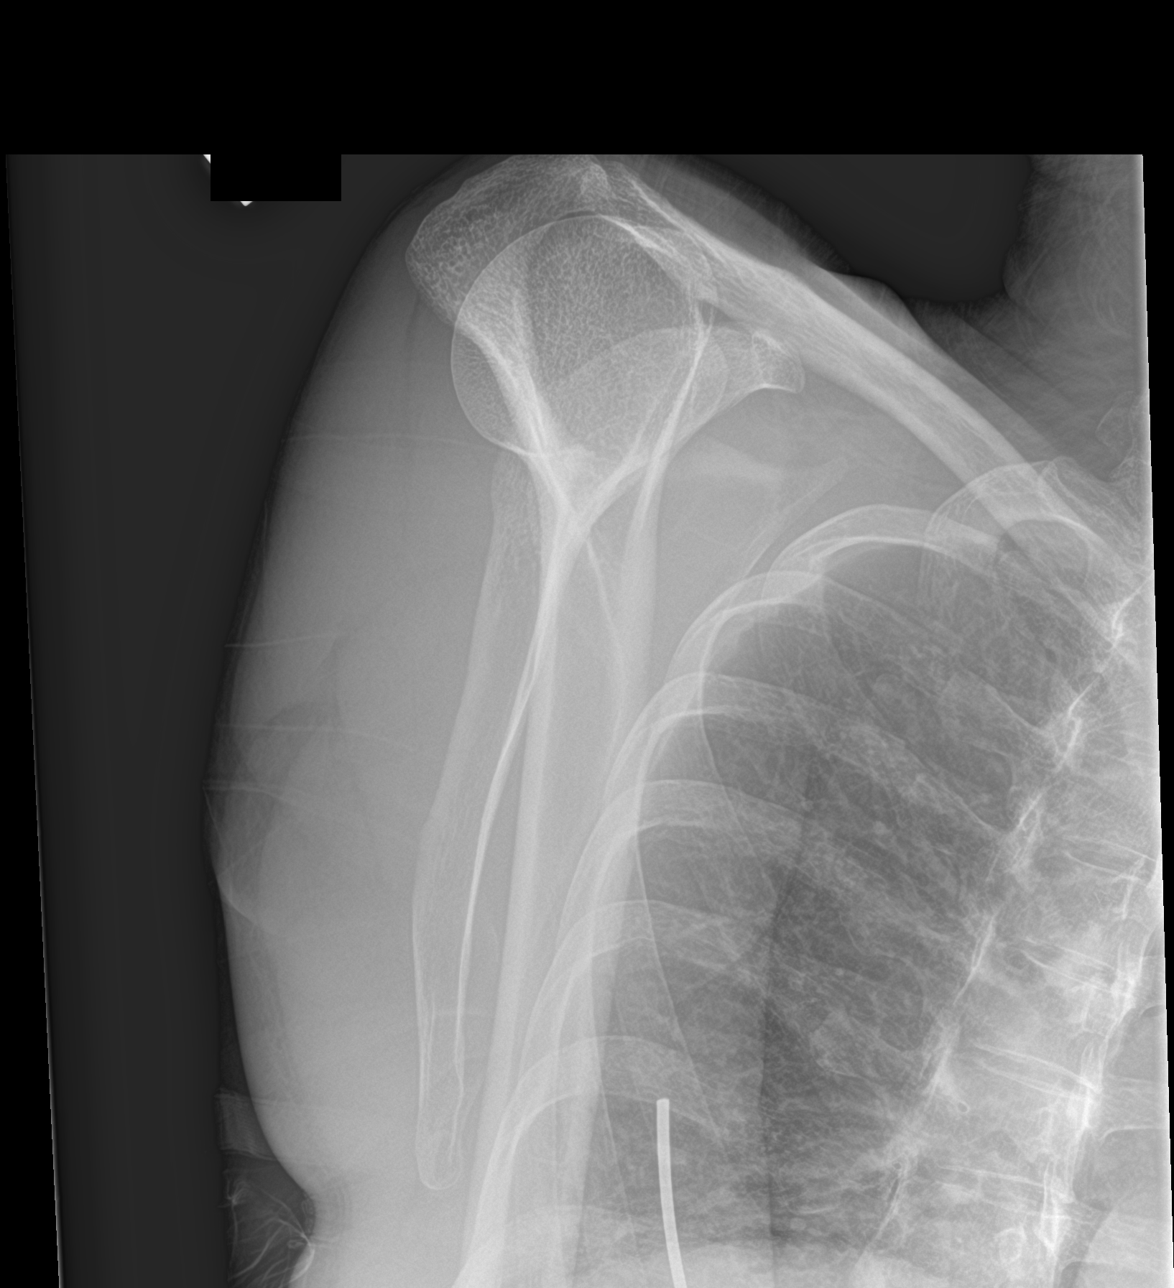

[shoulder axillary]
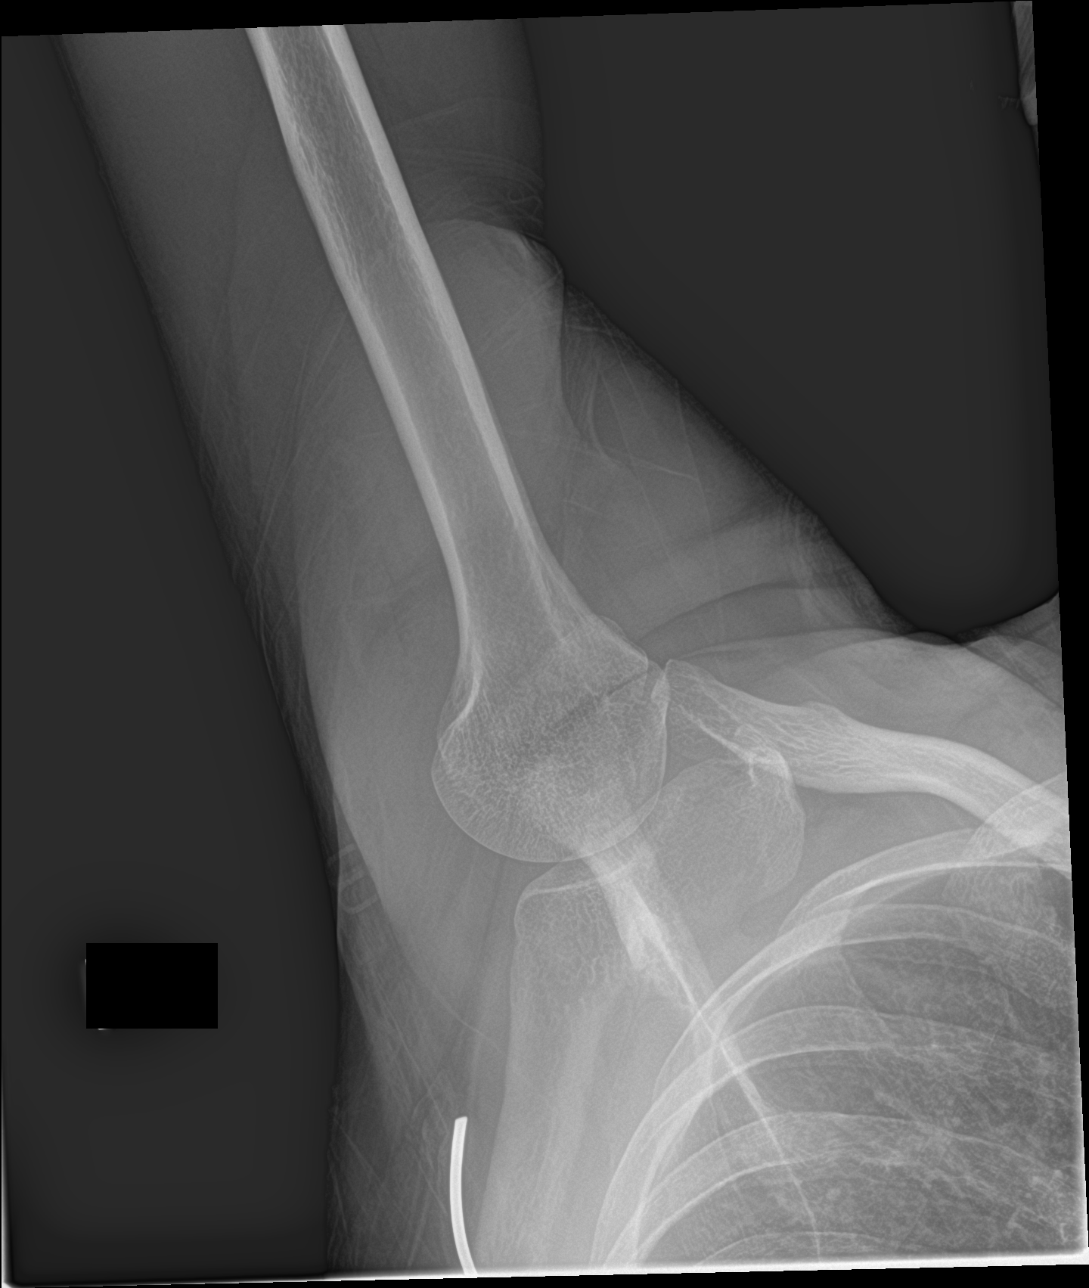

[3 of 3 positions shown; findings below may reference images not displayed]

FINDINGS: There is no evidence of fracture or dislocation. There is no
evidence of arthropathy or other focal bone abnormality. Soft
tissues are unremarkable.
IMPRESSION: No fracture, dislocation or arthropathy of the right shoulder.

## 2016-11-06 ENCOUNTER — Emergency Department (HOSPITAL_COMMUNITY)
Admission: EM | Admit: 2016-11-06 | Discharge: 2016-11-06 | Disposition: A | Discharge status: Home / Self Care | Payer: Medicaid Other | Attending: Emergency Medicine | Admitting: Emergency Medicine

## 2016-11-06 ENCOUNTER — Encounter (HOSPITAL_COMMUNITY): Payer: Self-pay | Admitting: *Deleted

## 2016-11-06 DIAGNOSIS — R202 Paresthesia of skin: Secondary | ICD-10-CM | POA: Diagnosis not present

## 2016-11-06 DIAGNOSIS — Z79899 Other long term (current) drug therapy: Secondary | ICD-10-CM | POA: Diagnosis not present

## 2016-11-06 DIAGNOSIS — R319 Hematuria, unspecified: Secondary | ICD-10-CM

## 2016-11-06 DIAGNOSIS — R509 Fever, unspecified: Secondary | ICD-10-CM | POA: Diagnosis present

## 2016-11-06 DIAGNOSIS — N390 Urinary tract infection, site not specified: Secondary | ICD-10-CM | POA: Diagnosis not present

## 2016-11-06 LAB — URINALYSIS, ROUTINE W REFLEX MICROSCOPIC
BILIRUBIN URINE: NEGATIVE
GLUCOSE, UA: 150 mg/dL — AB
KETONES UR: NEGATIVE mg/dL
NITRITE: POSITIVE — AB
PH: 5 (ref 5.0–8.0)
Protein, ur: 100 mg/dL — AB
Specific Gravity, Urine: 1.02 (ref 1.005–1.030)

## 2016-11-06 MED ORDER — CIPROFLOXACIN HCL 500 MG PO TABS: 500.0000 mg | ORAL_TABLET | Freq: Two times a day (BID) | ORAL | 0 refills | Status: AC

## 2016-11-06 NOTE — ED Provider Notes (Signed)
[facility]-EMERGENCY DEPT Provider Note   CSN: [id number] Arrival date & time: 11/06/16  1314   By signing my name below, I, Kathy Reed, attest that this documentation has been prepared under the direction and in the presence of [name], PA-C Electronically Signed: Soijett Reed, ED Scribe. 11/06/16. 3:45 PM  History   Chief Complaint Chief Complaint  Patient presents with  . Fever  . Urinary Tract Infection      Kathy Reed is a 31 y.o. female who presents to the Emergency Department complaining of fever (TMAX 102) onset 3 days ago. Pt reports associated urinary frequency, malodorous urine, and intermittent dysuria. Pt has tried OTC sudafed with minimal relief of her symptoms. She notes that she has a bladder sling in place and that she has had an increase in UTI's since the procedure. She denies flank pain, chills, and any other symptoms.   She secondarily complains of constant tingling sensation to her left outer calf onset 2-3 weeks ago. Pt hasn't tried any medications for her symptoms. She notes that she has a hx of a pinched nerve and no longer has a specialist that she is followed by. Denies numbness to remainder of left leg. Denies leg pain, swelling, numbness, and any other symptoms.    The history is provided by the patient. No language interpreter was used.    Past Medical History:  Diagnosis Date  . Arthritis    back  . Chondromalacia of both patellae   . Depression    denies- 01/26/17  . Drug abuse    HX Cocaine, [place]- [facility]  . Dysplasia of cervix, low grade (CIN 1)   . Hepatitis C   . Pre-eclampsia   . Psychosis    denies- 01/27/16  . Ulnar shaft fracture    left    Patient Active Problem List   Diagnosis Date Noted  . MDD (major depressive disorder), recurrent severe, without psychosis (HCC) 01/12/2016  . Opiate dependence, continuous (HCC) 01/12/2016  . Substance induced mood disorder (HCC) 01/11/2016  .  Preeclampsia 09/14/2010  . Heart palpitations 09/14/2010    Past Surgical History:  Procedure Laterality Date  . C SECTION X2    . CESAREAN SECTION    . CESAREAN SECTION  04/02/2011   Procedure: CESAREAN SECTION;  Surgeon: Bing Plume, MD;  Location: [facility]S;  Service: Gynecology;  Laterality: N/A;  Repeat of baby boy at [facility] 9/9  . DILATION AND CURETTAGE OF UTERUS    . INCONTINENCE SURGERY     Bladder Sling  . KNEE SURGERY Bilateral 2000,2001   for Patella Chondromalacia  . KNEE SURGERY    . ORIF ULNAR FRACTURE Left 02/01/2016   Procedure: OPEN REDUCTION INTERNAL FIXATION (ORIF)  LEFT ULNAR FRACTURE;  Surgeon: Bradly Bienenstock, MD;  Location: [facility] OR;  Service: Orthopedics;  Laterality: Left;  . TONSILLECTOMY      OB History    Gravida Para Term Preterm AB Living   5 2 2   2 3    SAB TAB Ectopic Multiple Live Births   2       1       Home Medications    Prior to Admission medications   Medication Sig Start Date End Date Taking? Authorizing Provider  ciprofloxacin (CIPRO) 500 MG tablet Take 1 tablet (500 mg total) by mouth 2 (two) times daily. 11/06/16   Paula Busenbark, Tinnie Gens, PA-C  diclofenac (VOLTAREN) 75 MG EC tablet Take 1 tablet (75 mg total)  by mouth 2 (two) times daily. For pain Patient not taking: Reported on 01/21/2016 01/17/16   Armandina StammerNwoko, Agnes I, NP  Hydrocodone-Acetaminophen (VICODIN) 5-300 MG TABS Take 1 tablet by mouth 2 (two) times daily as needed (PAIN). 02/01/16   Bradly Bienenstockrtmann, Fred, MD  sertraline (ZOLOFT) 25 MG tablet Take 1 tablet (25 mg total) by mouth daily. For depression Patient not taking: Reported on 01/21/2016 01/17/16   Armandina StammerNwoko, Agnes I, NP  traZODone (DESYREL) 50 MG tablet Take 1 tablet (50 mg total) by mouth at bedtime and may repeat dose one time if needed. For sleep Patient not taking: Reported on 01/21/2016 01/17/16   Armandina StammerNwoko, Agnes I, NP    Family History No family history on file.  Social History Social History  Substance Use Topics  . Smoking status: Never  Smoker  . Smokeless tobacco: Never Used  . Alcohol use No     Allergies   No known allergies   Review of Systems Review of Systems  Constitutional: Positive for fever (TMAX 102). Negative for chills.  Genitourinary: Positive for dysuria and frequency. Negative for flank pain.       +Malodorous urine  Musculoskeletal: Negative for arthralgias and joint swelling.  Neurological: Negative for numbness.       +tingling to left outer calf  All other systems reviewed and are negative.    Physical Exam Updated Vital Signs BP 110/79 (BP Location: Left Arm)   Pulse 82   Temp 98.7 F (37.1 C) (Oral)   Resp 18   SpO2 97%   Physical Exam  Constitutional: She is oriented to person, place, and time. She appears well-developed and well-nourished. No distress.  HENT:  Head: Normocephalic and atraumatic.  Eyes: EOM are normal.  Neck: Neck supple.  Cardiovascular: Normal rate.   Pulmonary/Chest: Effort normal. No respiratory distress.  Abdominal: Soft. She exhibits no distension. There is no tenderness. There is no CVA tenderness.  No CVA tenderness.  Musculoskeletal: Normal range of motion.       Cervical back: She exhibits no tenderness and no bony tenderness.       Thoracic back: She exhibits no tenderness and no bony tenderness.       Lumbar back: She exhibits no tenderness and no bony tenderness.  No C,T, or L spine tenderness. No sign of infectious etiology.  Neurological: She is alert and oriented to person, place, and time. She has normal strength.  Decreased sensation to lateral aspect of LLE. Strength intact.  Skin: Skin is warm and dry.  Psychiatric: She has a normal mood and affect. Her behavior is normal.  Nursing note and vitals reviewed.    ED Treatments / Results  DIAGNOSTIC STUDIES: Oxygen Saturation is 97% on RA, nl by my interpretation.    COORDINATION OF CARE: 3:43 PM Discussed treatment plan with pt at bedside which includes UA, referral to orthopedist,  and pt agreed to plan.    Labs (all labs ordered are listed, but only abnormal results are displayed) Labs Reviewed  URINALYSIS, ROUTINE W REFLEX MICROSCOPIC - Abnormal; Notable for the following:       Result Value   Color, Urine AMBER (*)    APPearance CLOUDY (*)    Glucose, UA 150 (*)    Hgb urine dipstick LARGE (*)    Protein, ur 100 (*)    Nitrite POSITIVE (*)    Leukocytes, UA LARGE (*)    Bacteria, UA RARE (*)    Squamous Epithelial / LPF 0-5 (*)  All other components within normal limits    EKG  EKG Interpretation None       Radiology No results found.  Procedures Procedures (including critical care time)  Medications Ordered in ED Medications - No data to display   Initial Impression / Assessment and Plan / ED Course  I have reviewed the triage vital signs and the nursing notes.  Pertinent labs & imaging results that were available during my care of the patient were reviewed by me and considered in my medical decision making (see chart for details).    Labs:  Urinalysis  Imaging:  Consults:  Therapeutics:  Discharge Meds:   Assessment/Plan: 31 year old female presents today with urinary tract infection. Patient reports fever, no fever or vital sign instability here. Patient will be treated for suspected pyelonephritis given her history of fever. Urine culture sent. Patient also notes tingling in the left lateral lower extremity, question if this is coming from her back. She denies any tenderness to palpation, no signs of infectious etiology. No need for further evaluation and management here in the ED. She referred to orthopedics for ongoing management, return precautions given. She verbalized understanding and agreement to today's plan had no further questions or concerns.   Final Clinical Impressions(s) / ED Diagnoses   Final diagnoses:  Urinary tract infection with hematuria, site unspecified  Paresthesia    New Prescriptions Discharge  Medication List as of 11/06/2016  4:22 PM    START taking these medications   Details  ciprofloxacin (CIPRO) 500 MG tablet Take 1 tablet (500 mg total) by mouth 2 (two) times daily., Starting Tue 11/06/2016, Print       I personally performed the services described in this documentation, which was scribed in my presence. The recorded information has been reviewed and is accurate.    Eyvonne Mechanic, PA-C 11/06/16 1630    Margarita Grizzle, MD 11/11/16 [phone]

## 2016-11-06 NOTE — ED Notes (Signed)
Pt is in stable condition upon d/c and ambulates from ED. 

## 2016-11-06 NOTE — ED Triage Notes (Signed)
Pt states that she has had fevers and symptoms of a UTI for 3 days. pt also reports numbness in the left outer part of her calf. Pt states that this has been going on for 3 weeks.

## 2016-11-06 NOTE — Discharge Instructions (Signed)
Please read attached information. If you experience any new or worsening signs or symptoms please return to the emergency room for evaluation. Please follow-up with your primary care provider or specialist as discussed. Please use medication prescribed only as directed and discontinue taking if you have any concerning signs or symptoms.   °

## 2017-08-02 DEATH — deceased
